# Patient Record
Sex: Female | Born: 1970 | Race: White | Hispanic: No | Marital: Married | Smoking: Never smoker
Health system: Southern US, Community
[De-identification: ages and names within clinical notes are randomized; demographics above are authoritative.]

## PROBLEM LIST (undated history)

## (undated) DIAGNOSIS — N879 Dysplasia of cervix uteri, unspecified: Secondary | ICD-10-CM

## (undated) DIAGNOSIS — E119 Type 2 diabetes mellitus without complications: Secondary | ICD-10-CM

## (undated) DIAGNOSIS — J40 Bronchitis, not specified as acute or chronic: Secondary | ICD-10-CM

## (undated) DIAGNOSIS — D649 Anemia, unspecified: Secondary | ICD-10-CM

## (undated) DIAGNOSIS — I38 Endocarditis, valve unspecified: Secondary | ICD-10-CM

## (undated) DIAGNOSIS — J189 Pneumonia, unspecified organism: Secondary | ICD-10-CM

## (undated) DIAGNOSIS — Z9889 Other specified postprocedural states: Secondary | ICD-10-CM

## (undated) DIAGNOSIS — R112 Nausea with vomiting, unspecified: Secondary | ICD-10-CM

## (undated) DIAGNOSIS — R51 Headache: Secondary | ICD-10-CM

## (undated) HISTORY — PX: TONSILLECTOMY: SUR1361

## (undated) HISTORY — PX: COLONOSCOPY: SHX174

## (undated) HISTORY — PX: CRYOTHERAPY: SHX1416

## (undated) HISTORY — PX: WISDOM TOOTH EXTRACTION: SHX21

## (undated) HISTORY — PX: DILATION AND CURETTAGE OF UTERUS: SHX78

---

## 2009-03-06 ENCOUNTER — Ambulatory Visit (HOSPITAL_COMMUNITY): Admission: AD | Admit: 2009-03-06 | Discharge: 2009-03-06 | Payer: Self-pay | Admitting: Obstetrics and Gynecology

## 2009-03-06 ENCOUNTER — Encounter (INDEPENDENT_AMBULATORY_CARE_PROVIDER_SITE_OTHER): Payer: Self-pay | Admitting: Obstetrics and Gynecology

## 2009-04-18 ENCOUNTER — Ambulatory Visit: Payer: Self-pay | Admitting: Hematology and Oncology

## 2009-04-26 LAB — IVY BLEEDING TIME: Bleeding Time: 6 Minutes (ref 2.0–8.0)

## 2009-04-26 LAB — CBC WITH DIFFERENTIAL/PLATELET
BASO%: 0.4 % (ref 0.0–2.0)
Basophils Absolute: 0 10*3/uL (ref 0.0–0.1)
EOS%: 0.3 % (ref 0.0–7.0)
Eosinophils Absolute: 0 10*3/uL (ref 0.0–0.5)
HCT: 38.1 % (ref 34.8–46.6)
HGB: 13.1 g/dL (ref 11.6–15.9)
LYMPH%: 35.7 % (ref 14.0–49.7)
MCH: 31.6 pg (ref 25.1–34.0)
MCHC: 34.4 g/dL (ref 31.5–36.0)
MCV: 92 fL (ref 79.5–101.0)
MONO#: 0.2 10*3/uL (ref 0.1–0.9)
MONO%: 5.1 % (ref 0.0–14.0)
NEUT#: 2.9 10*3/uL (ref 1.5–6.5)
NEUT%: 58.5 % (ref 38.4–76.8)
Platelets: 210 10*3/uL (ref 145–400)
RBC: 4.14 10*6/uL (ref 3.70–5.45)
RDW: 13 % (ref 11.2–14.5)
WBC: 4.9 10*3/uL (ref 3.9–10.3)
lymph#: 1.8 10*3/uL (ref 0.9–3.3)

## 2009-04-29 LAB — HYPERCOAGULABLE PANEL, COMPREHENSIVE RET.
AntiThromb III Func: 100 % (ref 76–126)
Anticardiolipin IgA: 35 [APL'U] (ref <13)
Anticardiolipin IgG: <7 [GPL'U] (ref <11)
Anticardiolipin IgM: <7 [MPL'U] (ref <10)
Beta-2 Glyco I IgG: <4 U/mL (ref <20)
Beta-2-Glycoprotein I IgA: <4 U/mL (ref <10)
Beta-2-Glycoprotein I IgM: <4 U/mL (ref <10)
DRVVT: 36.7 secs (ref 36.1–47.0)
Homocysteine: 5.6 umol/L (ref 4.0–15.4)
PTT Lupus Anticoagulant: 40.5 secs (ref 36.3–48.8)
Protein C Activity: 92 % (ref 75–133)
Protein C, Total: 76 % (ref 70–140)
Protein S Activity: 99 % (ref 69–129)
Protein S Ag, Total: 68 % — ABNORMAL LOW (ref 70–140)

## 2009-04-29 LAB — COMPREHENSIVE METABOLIC PANEL
ALT: 16 U/L (ref 0–35)
AST: 25 U/L (ref 0–37)
Albumin: 4.5 g/dL (ref 3.5–5.2)
Alkaline Phosphatase: 52 U/L (ref 39–117)
BUN: 14 mg/dL (ref 6–23)
CO2: 20 mEq/L (ref 19–32)
Calcium: 9 mg/dL (ref 8.4–10.5)
Chloride: 106 mEq/L (ref 96–112)
Creatinine, Ser: 0.73 mg/dL (ref 0.40–1.20)
Glucose, Bld: 99 mg/dL (ref 70–99)
Potassium: 4.2 mEq/L (ref 3.5–5.3)
Sodium: 139 mEq/L (ref 135–145)
Total Bilirubin: 0.6 mg/dL (ref 0.3–1.2)
Total Protein: 6.8 g/dL (ref 6.0–8.3)

## 2009-04-29 LAB — ANA: Anti Nuclear Antibody(ANA): NEGATIVE

## 2009-04-29 LAB — TSH: TSH: 1.419 u[IU]/mL (ref 0.350–4.500)

## 2009-10-06 ENCOUNTER — Ambulatory Visit (HOSPITAL_COMMUNITY): Admission: RE | Admit: 2009-10-06 | Discharge: 2009-10-06 | Payer: Self-pay | Admitting: Obstetrics and Gynecology

## 2009-11-22 ENCOUNTER — Ambulatory Visit (HOSPITAL_COMMUNITY): Admission: RE | Admit: 2009-11-22 | Discharge: 2009-11-22 | Payer: Self-pay | Admitting: Obstetrics and Gynecology

## 2010-01-03 ENCOUNTER — Ambulatory Visit (HOSPITAL_COMMUNITY): Admission: RE | Admit: 2010-01-03 | Discharge: 2010-01-03 | Payer: Self-pay | Admitting: Obstetrics and Gynecology

## 2010-04-03 ENCOUNTER — Inpatient Hospital Stay (HOSPITAL_COMMUNITY): Admission: AD | Admit: 2010-04-03 | Discharge: 2010-04-03 | Payer: Self-pay | Admitting: Obstetrics and Gynecology

## 2010-04-16 ENCOUNTER — Inpatient Hospital Stay (HOSPITAL_COMMUNITY): Admission: RE | Admit: 2010-04-16 | Discharge: 2010-04-18 | Payer: Self-pay | Admitting: Obstetrics and Gynecology

## 2011-01-13 ENCOUNTER — Encounter (HOSPITAL_COMMUNITY): Payer: Self-pay | Admitting: Obstetrics and Gynecology

## 2011-03-12 LAB — GLUCOSE, CAPILLARY
Glucose-Capillary: 70 mg/dL (ref 70–99)
Glucose-Capillary: 85 mg/dL (ref 70–99)
Glucose-Capillary: 92 mg/dL (ref 70–99)

## 2011-03-12 LAB — TYPE AND SCREEN
ABO/RH(D): A POS
Antibody Screen: NEGATIVE

## 2011-03-12 LAB — CBC
HCT: 33.1 % — ABNORMAL LOW (ref 36.0–46.0)
HCT: 34.1 % — ABNORMAL LOW (ref 36.0–46.0)
Hemoglobin: 11.3 g/dL — ABNORMAL LOW (ref 12.0–15.0)
Hemoglobin: 11.7 g/dL — ABNORMAL LOW (ref 12.0–15.0)
MCHC: 34.2 g/dL (ref 30.0–36.0)
MCHC: 34.2 g/dL (ref 30.0–36.0)
MCV: 94.8 fL (ref 78.0–100.0)
MCV: 96.3 fL (ref 78.0–100.0)
Platelets: 118 10*3/uL — ABNORMAL LOW (ref 150–400)
Platelets: 136 10*3/uL — ABNORMAL LOW (ref 150–400)
RBC: 3.44 MIL/uL — ABNORMAL LOW (ref 3.87–5.11)
RBC: 3.6 MIL/uL — ABNORMAL LOW (ref 3.87–5.11)
RDW: 14.8 % (ref 11.5–15.5)
RDW: 15.1 % (ref 11.5–15.5)
WBC: 11.1 10*3/uL — ABNORMAL HIGH (ref 4.0–10.5)
WBC: 8.1 10*3/uL (ref 4.0–10.5)

## 2011-03-12 LAB — BASIC METABOLIC PANEL
BUN: 8 mg/dL (ref 6–23)
CO2: 23 mEq/L (ref 19–32)
Calcium: 8.6 mg/dL (ref 8.4–10.5)
Chloride: 106 mEq/L (ref 96–112)
Creatinine, Ser: 0.64 mg/dL (ref 0.4–1.2)
GFR calc Af Amer: >60 mL/min (ref >60)
GFR calc non Af Amer: >60 mL/min (ref >60)
Glucose, Bld: 75 mg/dL (ref 70–99)
Potassium: 4.4 mEq/L (ref 3.5–5.1)
Sodium: 135 mEq/L (ref 135–145)

## 2011-03-12 LAB — RPR: RPR Ser Ql: NONREACTIVE

## 2011-03-12 LAB — ABO/RH: ABO/RH(D): A POS

## 2011-03-13 LAB — COMPREHENSIVE METABOLIC PANEL
ALT: 15 U/L (ref 0–35)
AST: 24 U/L (ref 0–37)
Albumin: 3.3 g/dL — ABNORMAL LOW (ref 3.5–5.2)
Alkaline Phosphatase: 106 U/L (ref 39–117)
BUN: 8 mg/dL (ref 6–23)
CO2: 24 mEq/L (ref 19–32)
Calcium: 9.4 mg/dL (ref 8.4–10.5)
Chloride: 104 mEq/L (ref 96–112)
Creatinine, Ser: 0.68 mg/dL (ref 0.4–1.2)
GFR calc Af Amer: >60 mL/min (ref >60)
GFR calc non Af Amer: >60 mL/min (ref >60)
Glucose, Bld: 77 mg/dL (ref 70–99)
Potassium: 3.7 mEq/L (ref 3.5–5.1)
Sodium: 136 mEq/L (ref 135–145)
Total Bilirubin: 0.2 mg/dL — ABNORMAL LOW (ref 0.3–1.2)
Total Protein: 6.5 g/dL (ref 6.0–8.3)

## 2011-03-13 LAB — CBC
HCT: 35.2 % — ABNORMAL LOW (ref 36.0–46.0)
Hemoglobin: 11.9 g/dL — ABNORMAL LOW (ref 12.0–15.0)
MCHC: 33.7 g/dL (ref 30.0–36.0)
MCV: 95.1 fL (ref 78.0–100.0)
Platelets: 155 10*3/uL (ref 150–400)
RBC: 3.7 MIL/uL — ABNORMAL LOW (ref 3.87–5.11)
RDW: 14.1 % (ref 11.5–15.5)
WBC: 9.2 10*3/uL (ref 4.0–10.5)

## 2011-03-13 LAB — LACTATE DEHYDROGENASE: LDH: 130 U/L (ref 94–250)

## 2011-03-13 LAB — URIC ACID: Uric Acid, Serum: 4.6 mg/dL (ref 2.4–7.0)

## 2011-04-04 LAB — CBC
HCT: 35.7 % — ABNORMAL LOW (ref 36.0–46.0)
Hemoglobin: 12.4 g/dL (ref 12.0–15.0)
MCHC: 34.7 g/dL (ref 30.0–36.0)
MCV: 93.1 fL (ref 78.0–100.0)
Platelets: 184 10*3/uL (ref 150–400)
RBC: 3.84 MIL/uL — ABNORMAL LOW (ref 3.87–5.11)
RDW: 12.4 % (ref 11.5–15.5)
WBC: 7.1 10*3/uL (ref 4.0–10.5)

## 2011-05-07 NOTE — Op Note (Signed)
Patricia Levine, Patricia Levine                 ACCOUNT NO.:  [id number]   MEDICAL RECORD NO.:  [id number]          PATIENT TYPE:  AMB   LOCATION:  [facility]                          FACILITY:  [facility]   PHYSICIAN:  Dineen Kid. Rana Snare, M.D.    DATE OF BIRTH:  Sep 24, 1971   DATE OF PROCEDURE:  DATE OF DISCHARGE:                               OPERATIVE REPORT   PREOPERATIVE DIAGNOSIS:  Intrauterine pregnancy at 9-1/2 weeks'  gestational age with known embryonic demise and incomplete abortion.   POSTOPERATIVE DIAGNOSIS:  Intrauterine pregnancy at 9-1/2 weeks'  gestational age with known embryonic demise and incomplete abortion.Marland Kitchen   PROCEDURE:  Dilation and evacuation.   SURGEON:  Dineen Kid. Rana Snare, M.D.   ANESTHESIA:  Monitored anesthetic care and paracervical block.   INDICATIONS:  [name] is a 40 year old G4, P1, and 9-1/2 weeks with a  embryonic demise based on ultrasound 3 days ago.  She presents today  with cramping and bleeding with cervical dilation and stable hemoglobin  at 12.4 with blood type A positive.  She desires to proceed with D and E  due to the cramping and bleeding.  The risks, benefits of procedure were  discussed at length.  An informed consent was obtained.   FINDINGS:  Findings at the time of surgery were products of conception.   DESCRIPTION OF PROCEDURE:  After adequate analgesia, the patient was  placed in the dorsal lithotomy position.  She was sterilely prepped and  draped.  The bladder sterilely drained.  Graves speculum was placed.  Tenaculum was placed on the anterior lip of cervix.  The cervix was  initially dilated to 1-2 cm.  An 8-mm suction curette was easily  inserted and products of conception were retrieved.  Curettage performed  until a gritty surface was felt throughout the endometrial cavity with  no residual products of conception retrieved.  The patient received  Methergine 0.2 mg IM with good uterine response.  The curette was then  removed.  Tenaculum was removed  from anterior lip of cervix, noted to be  hemostatic.  The patient was then transferred to recovery room in stable  condition.  Sponge and instrument count was normal x3.  Estimated blood  loss was minimal.  The patient received 30 mg of Toradol  postoperatively.   DISPOSITION:  The patient to be discharged home.  She will follow up in  the office in 2-3 weeks.  Sent home with routine instruction sheet for  D&E.  To return for increased pain, fever, or bleeding.  She is also  sent home with prescription for doxycycline 100 mg p.o. b.i.d. times 7  days and Methergine 0.2 mg take three times a day for 3 days.      Dineen Kid Rana Snare, M.D.  Electronically Signed     DCL/MEDQ  D:  03/06/2009  T:  03/07/2009  Job:  [id number]

## 2012-03-11 ENCOUNTER — Ambulatory Visit: Payer: Self-pay | Admitting: Sports Medicine

## 2012-04-09 ENCOUNTER — Other Ambulatory Visit: Payer: Self-pay | Admitting: Obstetrics and Gynecology

## 2012-04-09 DIAGNOSIS — N6320 Unspecified lump in the left breast, unspecified quadrant: Secondary | ICD-10-CM

## 2012-04-09 DIAGNOSIS — N644 Mastodynia: Secondary | ICD-10-CM

## 2012-04-09 DIAGNOSIS — N6310 Unspecified lump in the right breast, unspecified quadrant: Secondary | ICD-10-CM

## 2012-04-13 ENCOUNTER — Ambulatory Visit
Admission: RE | Admit: 2012-04-13 | Discharge: 2012-04-13 | Disposition: A | Discharge status: Home / Self Care | Payer: BC Managed Care – PPO | Source: Ambulatory Visit | Attending: Obstetrics and Gynecology | Admitting: Obstetrics and Gynecology

## 2012-04-13 DIAGNOSIS — N6310 Unspecified lump in the right breast, unspecified quadrant: Secondary | ICD-10-CM

## 2012-04-13 DIAGNOSIS — N6320 Unspecified lump in the left breast, unspecified quadrant: Secondary | ICD-10-CM

## 2012-04-13 DIAGNOSIS — N644 Mastodynia: Secondary | ICD-10-CM

## 2012-04-15 ENCOUNTER — Ambulatory Visit (INDEPENDENT_AMBULATORY_CARE_PROVIDER_SITE_OTHER): Payer: BC Managed Care – PPO | Admitting: Sports Medicine

## 2012-04-15 VITALS — BP 140/80

## 2012-04-15 DIAGNOSIS — M7741 Metatarsalgia, right foot: Secondary | ICD-10-CM | POA: Insufficient documentation

## 2012-04-15 DIAGNOSIS — M7750 Other enthesopathy of unspecified foot: Secondary | ICD-10-CM

## 2012-04-15 NOTE — Progress Notes (Signed)
Patient ID: Doristine Devoid, female   DOB: 1971/08/16, 41 y.o.   MRN: [id number] Pt referred to [place] by Dr. Margaretha Sheffield for foot pain while running.  Was given sports insole with metatarsal pads that proved uncomfortable.  On inspection of the metatarsal pad, it was found to have moved out of position. She has been running since age 72 No serious injuries When she gets longer than 5 miles she has had some persistent overuse injuries in the past  Heel lift and treatment for a left gastrocnemius tear has been helpful   Objective: Gen: Well developed female, no distress Feet/ankles: Drop of 3rd and 4th metatarsal heads on right.  On left foot, 3rd metatarsal head is dropped.  Pt does have significant forefoot callous.  Right leg is 1cm longer than left. Slight hammering of 3d and 4th toes on the right.  Feet are relatively neutral while standing. Pt has good running form.  Assessment/Plan: Metatarsalgia: Tx with metatarsal pad in correct place.  Pt instructed to return if she is not noticing relief.

## 2012-04-15 NOTE — Patient Instructions (Signed)
It was great to see you today! If you end up taking the pad off before your marathon, place one back after the race. Come back to see [place] if this is not getting better.

## 2012-04-15 NOTE — Assessment & Plan Note (Signed)
If she does well with the current modification we will provide her with information to get the over-the-counter insoles and metatarsal pads for other shoes  She will teslas for one month  She probably will not need custom orthotics if this works well

## 2014-08-26 ENCOUNTER — Encounter (HOSPITAL_COMMUNITY): Payer: Self-pay | Admitting: Pharmacist

## 2014-09-05 NOTE — H&P (Addendum)
43 yo with heavy irregular menses presents for surgical mngt.  Desires sterilization  PMHx:  T2DM (diet controlled) PSHx:  T&A, c-section x 2, D&C x 2 All:  Codeine Meds:  OCPs, ASA, vit E, Allegra, MTV SHx:  Neg tobacco and ivdu FHx:  DM, HTN  AF, VSS Gen - NAD Abd - soft, NT CV - RRR Lungs - clear Ext - NT  A/P:  Irregular heavy VB and desires sterilization Hysteroscopy D&C, laparoscopic BTL R/b/a discussed, questions ansered, informed consent

## 2014-09-06 ENCOUNTER — Encounter (HOSPITAL_COMMUNITY)
Admission: RE | Admit: 2014-09-06 | Discharge: 2014-09-06 | Disposition: A | Discharge status: Home / Self Care | Payer: BC Managed Care – PPO | Source: Ambulatory Visit | Attending: Obstetrics and Gynecology | Admitting: Obstetrics and Gynecology

## 2014-09-06 ENCOUNTER — Encounter (HOSPITAL_COMMUNITY): Payer: Self-pay

## 2014-09-06 DIAGNOSIS — N926 Irregular menstruation, unspecified: Secondary | ICD-10-CM | POA: Diagnosis present

## 2014-09-06 DIAGNOSIS — N920 Excessive and frequent menstruation with regular cycle: Secondary | ICD-10-CM | POA: Diagnosis not present

## 2014-09-06 DIAGNOSIS — Z302 Encounter for sterilization: Secondary | ICD-10-CM | POA: Diagnosis not present

## 2014-09-06 DIAGNOSIS — R011 Cardiac murmur, unspecified: Secondary | ICD-10-CM | POA: Diagnosis not present

## 2014-09-06 DIAGNOSIS — E119 Type 2 diabetes mellitus without complications: Secondary | ICD-10-CM | POA: Diagnosis not present

## 2014-09-06 DIAGNOSIS — D649 Anemia, unspecified: Secondary | ICD-10-CM | POA: Diagnosis not present

## 2014-09-06 HISTORY — DX: Type 2 diabetes mellitus without complications: E11.9

## 2014-09-06 HISTORY — DX: Dysplasia of cervix uteri, unspecified: N87.9

## 2014-09-06 HISTORY — DX: Headache: R51

## 2014-09-06 HISTORY — DX: Endocarditis, valve unspecified: I38

## 2014-09-06 HISTORY — DX: Other specified postprocedural states: R11.2

## 2014-09-06 HISTORY — DX: Other specified postprocedural states: Z98.890

## 2014-09-06 HISTORY — DX: Anemia, unspecified: D64.9

## 2014-09-06 LAB — CBC
HCT: 38.8 % (ref 36.0–46.0)
Hemoglobin: 13.3 g/dL (ref 12.0–15.0)
MCH: 31.5 pg (ref 26.0–34.0)
MCHC: 34.3 g/dL (ref 30.0–36.0)
MCV: 91.9 fL (ref 78.0–100.0)
Platelets: 239 10*3/uL (ref 150–400)
RBC: 4.22 MIL/uL (ref 3.87–5.11)
RDW: 12.5 % (ref 11.5–15.5)
WBC: 5.6 10*3/uL (ref 4.0–10.5)

## 2014-09-06 LAB — GLUCOSE, CAPILLARY: Glucose-Capillary: 85 mg/dL (ref 70–99)

## 2014-09-06 NOTE — Patient Instructions (Signed)
Your procedure is scheduled on:  Friday, September 09, 2014  Enter through the [employer] [facility] at: 6:00 A.M.  Pick up the phone at the desk and dial [phone].  Call this number if you have problems the morning of surgery: [phone].  Remember: Do NOT eat food: AFTER MIDNIGHT THURSDAY Do NOT drink clear liquids after: AFTER MIDNIGHT THURSDAY  Take these medicines the morning of surgery with a SIP OF WATER: NONE *STOP TAKING VITAMINS, HERBS, AND ASPIRIN  Do NOT wear jewelry (body piercing), metal hair clips/bobby pins, make-up, or nail polish. Do NOT wear lotions, powders, or perfumes.  You may wear deoderant. Do NOT shave for 48 hours prior to surgery. Do NOT bring valuables to the hospital. Contacts, dentures, or bridgework may not be worn into surgery.  Have a responsible adult drive you home and stay with you for 24 hours after your procedure

## 2014-09-06 NOTE — Pre-Procedure Instructions (Signed)
Ms. Patricia Levine capillary glucose 85

## 2014-09-08 MED ORDER — DEXTROSE 5 % IV SOLN
2.0000 g | INTRAVENOUS | Status: AC
  Administered 2014-09-09: 2 g via INTRAVENOUS
  Filled 2014-09-08: qty 2

## 2014-09-09 ENCOUNTER — Ambulatory Visit (HOSPITAL_COMMUNITY)
Admission: RE | Admit: 2014-09-09 | Discharge: 2014-09-09 | Disposition: A | Discharge status: Home / Self Care | Payer: BC Managed Care – PPO | Source: Ambulatory Visit | Attending: Obstetrics and Gynecology | Admitting: Obstetrics and Gynecology

## 2014-09-09 ENCOUNTER — Encounter (HOSPITAL_COMMUNITY): Payer: Self-pay | Admitting: Anesthesiology

## 2014-09-09 ENCOUNTER — Ambulatory Visit (HOSPITAL_COMMUNITY): Payer: BC Managed Care – PPO | Admitting: Anesthesiology

## 2014-09-09 ENCOUNTER — Encounter (HOSPITAL_COMMUNITY)
Admission: RE | Disposition: A | Discharge status: Home / Self Care | Payer: Self-pay | Source: Ambulatory Visit | Attending: Obstetrics and Gynecology

## 2014-09-09 ENCOUNTER — Encounter (HOSPITAL_COMMUNITY): Payer: BC Managed Care – PPO | Admitting: Anesthesiology

## 2014-09-09 DIAGNOSIS — Z302 Encounter for sterilization: Secondary | ICD-10-CM | POA: Insufficient documentation

## 2014-09-09 DIAGNOSIS — D649 Anemia, unspecified: Secondary | ICD-10-CM | POA: Insufficient documentation

## 2014-09-09 DIAGNOSIS — N926 Irregular menstruation, unspecified: Secondary | ICD-10-CM | POA: Insufficient documentation

## 2014-09-09 DIAGNOSIS — R011 Cardiac murmur, unspecified: Secondary | ICD-10-CM | POA: Insufficient documentation

## 2014-09-09 DIAGNOSIS — E119 Type 2 diabetes mellitus without complications: Secondary | ICD-10-CM | POA: Insufficient documentation

## 2014-09-09 DIAGNOSIS — N920 Excessive and frequent menstruation with regular cycle: Secondary | ICD-10-CM | POA: Diagnosis not present

## 2014-09-09 HISTORY — PX: LAPAROSCOPIC TUBAL LIGATION: SHX1937

## 2014-09-09 HISTORY — PX: DILITATION & CURRETTAGE/HYSTROSCOPY WITH NOVASURE ABLATION: SHX5568

## 2014-09-09 LAB — GLUCOSE, CAPILLARY: Glucose-Capillary: 97 mg/dL (ref 70–99)

## 2014-09-09 LAB — PREGNANCY, URINE: Preg Test, Ur: NEGATIVE

## 2014-09-09 SURGERY — LIGATION, FALLOPIAN TUBE, LAPAROSCOPIC
Anesthesia: General

## 2014-09-09 MED ORDER — GLYCOPYRROLATE 0.2 MG/ML IJ SOLN
INTRAMUSCULAR | Status: AC
  Filled 2014-09-09: qty 1

## 2014-09-09 MED ORDER — FENTANYL CITRATE 0.05 MG/ML IJ SOLN
INTRAMUSCULAR | Status: DC | PRN
  Administered 2014-09-09: 50 ug via INTRAVENOUS
  Administered 2014-09-09 (×2): 100 ug via INTRAVENOUS

## 2014-09-09 MED ORDER — DEXAMETHASONE SODIUM PHOSPHATE 10 MG/ML IJ SOLN
INTRAMUSCULAR | Status: DC | PRN
  Administered 2014-09-09: 4 mg via INTRAVENOUS

## 2014-09-09 MED ORDER — OXYCODONE-ACETAMINOPHEN 5-325 MG PO TABS
1.0000 | ORAL_TABLET | Freq: Once | ORAL | Status: AC | PRN
  Administered 2014-09-09: 1 via ORAL

## 2014-09-09 MED ORDER — MIDAZOLAM HCL 2 MG/2ML IJ SOLN
INTRAMUSCULAR | Status: AC
  Filled 2014-09-09: qty 2

## 2014-09-09 MED ORDER — ROCURONIUM BROMIDE 100 MG/10ML IV SOLN
INTRAVENOUS | Status: AC
  Filled 2014-09-09: qty 1

## 2014-09-09 MED ORDER — OXYCODONE-ACETAMINOPHEN 5-325 MG PO TABS
ORAL_TABLET | ORAL | Status: AC
  Filled 2014-09-09: qty 1

## 2014-09-09 MED ORDER — GLYCOPYRROLATE 0.2 MG/ML IJ SOLN
INTRAMUSCULAR | Status: DC | PRN
  Administered 2014-09-09: .4 mg via INTRAVENOUS

## 2014-09-09 MED ORDER — FENTANYL CITRATE 0.05 MG/ML IJ SOLN
25.0000 ug | INTRAMUSCULAR | Status: DC | PRN
  Administered 2014-09-09 (×4): 50 ug via INTRAVENOUS

## 2014-09-09 MED ORDER — NEOSTIGMINE METHYLSULFATE 10 MG/10ML IV SOLN
INTRAVENOUS | Status: AC
  Filled 2014-09-09: qty 1

## 2014-09-09 MED ORDER — LIDOCAINE HCL (CARDIAC) 20 MG/ML IV SOLN
INTRAVENOUS | Status: AC
  Filled 2014-09-09: qty 5

## 2014-09-09 MED ORDER — PROPOFOL 10 MG/ML IV BOLUS
INTRAVENOUS | Status: DC | PRN
  Administered 2014-09-09: 200 mg via INTRAVENOUS

## 2014-09-09 MED ORDER — LACTATED RINGERS IV SOLN
INTRAVENOUS | Status: DC
  Administered 2014-09-09 (×2): via INTRAVENOUS

## 2014-09-09 MED ORDER — METOCLOPRAMIDE HCL 5 MG/ML IJ SOLN
INTRAMUSCULAR | Status: AC
  Filled 2014-09-09: qty 2

## 2014-09-09 MED ORDER — FENTANYL CITRATE 0.05 MG/ML IJ SOLN
INTRAMUSCULAR | Status: AC
  Filled 2014-09-09: qty 5

## 2014-09-09 MED ORDER — MIDAZOLAM HCL 2 MG/2ML IJ SOLN
INTRAMUSCULAR | Status: DC | PRN
  Administered 2014-09-09: 2 mg via INTRAVENOUS

## 2014-09-09 MED ORDER — FENTANYL CITRATE 0.05 MG/ML IJ SOLN
INTRAMUSCULAR | Status: AC
  Filled 2014-09-09: qty 2

## 2014-09-09 MED ORDER — SCOPOLAMINE 1 MG/3DAYS TD PT72
MEDICATED_PATCH | TRANSDERMAL | Status: AC
  Administered 2014-09-09: 1.5 mg via TRANSDERMAL
  Filled 2014-09-09: qty 1

## 2014-09-09 MED ORDER — SCOPOLAMINE 1 MG/3DAYS TD PT72
1.0000 | MEDICATED_PATCH | Freq: Once | TRANSDERMAL | Status: DC
  Administered 2014-09-09: 1.5 mg via TRANSDERMAL

## 2014-09-09 MED ORDER — MEPERIDINE HCL 25 MG/ML IJ SOLN: 6.2500 mg | INTRAMUSCULAR | Status: DC | PRN

## 2014-09-09 MED ORDER — ONDANSETRON HCL 4 MG/2ML IJ SOLN
INTRAMUSCULAR | Status: DC | PRN
  Administered 2014-09-09: 4 mg via INTRAVENOUS

## 2014-09-09 MED ORDER — ROCURONIUM BROMIDE 100 MG/10ML IV SOLN
INTRAVENOUS | Status: DC | PRN
  Administered 2014-09-09: 40 mg via INTRAVENOUS

## 2014-09-09 MED ORDER — LIDOCAINE HCL (CARDIAC) 20 MG/ML IV SOLN
INTRAVENOUS | Status: DC | PRN
  Administered 2014-09-09: 80 mg via INTRAVENOUS

## 2014-09-09 MED ORDER — METOCLOPRAMIDE HCL 5 MG/ML IJ SOLN
10.0000 mg | Freq: Once | INTRAMUSCULAR | Status: AC | PRN
  Administered 2014-09-09: 10 mg via INTRAVENOUS

## 2014-09-09 MED ORDER — GLYCOPYRROLATE 0.2 MG/ML IJ SOLN
INTRAMUSCULAR | Status: AC
  Filled 2014-09-09: qty 2

## 2014-09-09 MED ORDER — KETOROLAC TROMETHAMINE 30 MG/ML IJ SOLN
INTRAMUSCULAR | Status: DC | PRN
  Administered 2014-09-09: 30 mg via INTRAMUSCULAR
  Administered 2014-09-09: 30 mg via INTRAVENOUS

## 2014-09-09 MED ORDER — LACTATED RINGERS IV SOLN
INTRAVENOUS | Status: DC | PRN
  Administered 2014-09-09: 3000 mL

## 2014-09-09 MED ORDER — BUPIVACAINE HCL (PF) 0.25 % IJ SOLN
INTRAMUSCULAR | Status: DC | PRN
  Administered 2014-09-09: 5 mL

## 2014-09-09 MED ORDER — ONDANSETRON HCL 4 MG/2ML IJ SOLN
INTRAMUSCULAR | Status: AC
  Filled 2014-09-09: qty 2

## 2014-09-09 MED ORDER — DEXAMETHASONE SODIUM PHOSPHATE 4 MG/ML IJ SOLN
INTRAMUSCULAR | Status: AC
  Filled 2014-09-09: qty 1

## 2014-09-09 MED ORDER — PROPOFOL 10 MG/ML IV EMUL
INTRAVENOUS | Status: AC
  Filled 2014-09-09: qty 20

## 2014-09-09 MED ORDER — LIDOCAINE HCL 1 % IJ SOLN
INTRAMUSCULAR | Status: AC
  Filled 2014-09-09: qty 20

## 2014-09-09 MED ORDER — BUPIVACAINE HCL (PF) 0.25 % IJ SOLN
INTRAMUSCULAR | Status: AC
  Filled 2014-09-09: qty 30

## 2014-09-09 MED ORDER — DIPHENHYDRAMINE HCL 50 MG/ML IJ SOLN
INTRAMUSCULAR | Status: AC
  Filled 2014-09-09: qty 1

## 2014-09-09 MED ORDER — DIPHENHYDRAMINE HCL 50 MG/ML IJ SOLN
INTRAMUSCULAR | Status: DC | PRN
  Administered 2014-09-09: 25 mg via INTRAVENOUS

## 2014-09-09 MED ORDER — NEOSTIGMINE METHYLSULFATE 10 MG/10ML IV SOLN
INTRAVENOUS | Status: DC | PRN
Start: 2014-09-09 — End: 2014-09-09
  Administered 2014-09-09: 3 mg via INTRAVENOUS

## 2014-09-09 MED ORDER — OXYCODONE-ACETAMINOPHEN 5-325 MG PO TABS: 2.0000 | ORAL_TABLET | ORAL | Status: DC | PRN

## 2014-09-09 SURGICAL SUPPLY — 30 items
ABLATOR ENDOMETRIAL BIPOLAR (ABLATOR) ×3 IMPLANT
CATH ROBINSON RED A/P 16FR (CATHETERS) ×3 IMPLANT
CHLORAPREP W/TINT 26ML (MISCELLANEOUS) ×6 IMPLANT
CLIP FILSHIE TUBAL LIGA STRL (Clip) ×3 IMPLANT
CLOTH BEACON ORANGE TIMEOUT ST (SAFETY) ×3 IMPLANT
CONTAINER PREFILL 10% NBF 60ML (FORM) ×3 IMPLANT
DERMABOND ADVANCED (GAUZE/BANDAGES/DRESSINGS) ×1
DERMABOND ADVANCED .7 DNX12 (GAUZE/BANDAGES/DRESSINGS) ×2 IMPLANT
DRAPE HYSTEROSCOPY (DRAPE) ×3 IMPLANT
DRSG COVADERM PLUS 2X2 (GAUZE/BANDAGES/DRESSINGS) ×6 IMPLANT
DRSG OPSITE POSTOP 3X4 (GAUZE/BANDAGES/DRESSINGS) ×3 IMPLANT
GLOVE BIO SURGEON STRL SZ 6.5 (GLOVE) ×3 IMPLANT
GLOVE BIOGEL PI IND STRL 7.0 (GLOVE) ×2 IMPLANT
GLOVE BIOGEL PI INDICATOR 7.0 (GLOVE) ×1
GOWN STRL REUS W/TWL LRG LVL3 (GOWN DISPOSABLE) ×6 IMPLANT
PACK LAPAROSCOPY BASIN (CUSTOM PROCEDURE TRAY) ×3 IMPLANT
PACK VAGINAL MINOR WOMEN LF (CUSTOM PROCEDURE TRAY) ×3 IMPLANT
PAD OB MATERNITY 4.3X12.25 (PERSONAL CARE ITEMS) ×3 IMPLANT
SET IRRIG TUBING LAPAROSCOPIC (IRRIGATION / IRRIGATOR) IMPLANT
SET TUBING HYSTEROSCOPY 2 NDL (TUBING) ×3 IMPLANT
SUT VIC AB 3-0 PS2 18 (SUTURE) ×1
SUT VIC AB 3-0 PS2 18XBRD (SUTURE) ×2 IMPLANT
SUT VICRYL 0 UR6 27IN ABS (SUTURE) ×3 IMPLANT
TOWEL OR 17X24 6PK STRL BLUE (TOWEL DISPOSABLE) ×6 IMPLANT
TROCAR OPTI TIP 5M 100M (ENDOMECHANICALS) ×3 IMPLANT
TROCAR XCEL NON-BLD 11X100MML (ENDOMECHANICALS) ×3 IMPLANT
TROCAR XCEL OPT SLVE 5M 100M (ENDOMECHANICALS) ×3 IMPLANT
TUBE HYSTEROSCOPY W Y-CONNECT (TUBING) ×3 IMPLANT
WARMER LAPAROSCOPE (MISCELLANEOUS) ×3 IMPLANT
WATER STERILE IRR 1000ML POUR (IV SOLUTION) ×3 IMPLANT

## 2014-09-09 NOTE — Op Note (Signed)
NAMESHARISA, TOVES NO.:  [id number]  MEDICAL RECORD NO.:  [id number]  LOCATION:  [facility]                          FACILITY:  [facility]  PHYSICIAN:  Zelphia Cairo, MD    DATE OF BIRTH:  May 07, 1971  DATE OF PROCEDURE:  09/09/2014 DATE OF DISCHARGE:                              OPERATIVE REPORT   PREOPERATIVE DIAGNOSES: 1. Desires sterilization. 2. Menorrhagia.  POSTOPERATIVE DIAGNOSES: 1. Desires sterilization. 2. Menorrhagia.  PROCEDURES: 1. Paracervical block. 2. Hysteroscopy, D and C. 3. NovaSure endometrial ablation. 4. Laparoscopic bilateral tubal ligation with Filshie clips.  SURGEON:  Zelphia Cairo, MD.  ANESTHESIA:  General.  COMPLICATIONS:  None.  SPECIMEN:  Endometrial curettings.  CONDITION:  Stable to recovery room.  PROCEDURE IN DETAIL:  The patient was taken to the operating room. After informed consent was obtained,  she was placed in the dorsal lithotomy position using Allen stirrups, given general anesthesia and prepped and draped in sterile fashion.  In and out catheter was used to drain her bladder.  Bivalve speculum was placed in the vagina and a single-tooth tenaculum attached to the anterior lip of the cervix.  The cervix was serially dilated using Pratt dilators and the uterus was sounded.  Diagnostic hysteroscope was inserted and a survey of the cavity was performed.  No abnormalities were seen.  Bilateral ostia were visualized.  Hysteroscope was removed.  A gentle endometrial curetting was performed.  Specimen was placed on Telfa and passed off to be sent to pathology.  NovaSure ablation was then performed using standard manufacture guidelines.  Once the cycle was complete, the NovaSure was removed.  The tenaculum was removed from the cervix.  The cervix was hemostatic.  Speculum was removed and our attention was turned to the abdomen.  Marcaine 0.25% was used to provide local anesthesia at the site of our infraumbilical  incision.  Infraumbilical skin incision was made with a scalpel and the optical trocar was inserted under direct visualization. Once intraperitoneal placement was confirmed, CO2 was turned on and the abdomen and pelvis were insufflated.  Bilateral fallopian tubes were visualized and appeared normal.  Filshie clips were placed bilaterally. All instruments and trocars were then removed from the abdomen.  A deep stitch was placed in the infraumbilical incision and the skin incisions were closed with Vicryl. Dermabond was placed.  The patient was extubated and taken to the recovery room in stable condition.  Sponge, lap, needle, and instrument counts were correct x2.     Zelphia Cairo, MD     GA/MEDQ  D:  09/09/2014  T:  09/09/2014  Job:  [id number]

## 2014-09-09 NOTE — Discharge Instructions (Addendum)

## 2014-09-09 NOTE — Anesthesia Preprocedure Evaluation (Addendum)
Anesthesia Evaluation  Patient identified by MRN, date of birth, ID band Patient awake    Reviewed: Allergy & Precautions, H&P , NPO status , Patient's Chart, lab work & pertinent test results  History of Anesthesia Complications (+) PONV and history of anesthetic complications  Airway Mallampati: II TM Distance: >3 FB Neck ROM: Full    Dental no notable dental hx. (+) Teeth Intact, Dental Advisory Given   Pulmonary neg pulmonary ROS,  breath sounds clear to auscultation  Pulmonary exam normal       Cardiovascular negative cardio ROS  + Valvular Problems/Murmurs Rhythm:Regular Rate:Normal     Neuro/Psych  Headaches, negative psych ROS   GI/Hepatic negative GI ROS, Neg liver ROS,   Endo/Other  diabetes  Renal/GU negative Renal ROS  negative genitourinary   Musculoskeletal negative musculoskeletal ROS (+)   Abdominal   Peds  Hematology  (+) anemia ,   Anesthesia Other Findings Orthodontic appliances  Reproductive/Obstetrics Desires permanent contraception Menorrhagia                          Anesthesia Physical Anesthesia Plan  ASA: II  Anesthesia Plan: General   Post-op Pain Management:    Induction: Intravenous  Airway Management Planned: Oral ETT  Additional Equipment:   Intra-op Plan:   Post-operative Plan: Extubation in OR  Informed Consent: I have reviewed the patients History and Physical, chart, labs and discussed the procedure including the risks, benefits and alternatives for the proposed anesthesia with the patient or authorized representative who has indicated his/her understanding and acceptance.   Dental advisory given  Plan Discussed with: Anesthesiologist, CRNA and Surgeon  Anesthesia Plan Comments:         Anesthesia Quick Evaluation

## 2014-09-09 NOTE — Transfer of Care (Signed)
Immediate Anesthesia Transfer of Care Note  Patient: Patricia Levine  Procedure(s) Performed: Procedure(s): LAPAROSCOPIC TUBAL LIGATION with Filshie Clips (Bilateral) DILATATION & CURETTAGE/HYSTEROSCOPY WITH NOVASURE ABLATION (N/A)  Patient Location: PACU  Anesthesia Type:General  Level of Consciousness: awake, alert  and oriented  Airway & Oxygen Therapy: Patient Spontanous Breathing and Patient connected to nasal cannula oxygen  Post-op Assessment: Report given to PACU RN and Post -op Vital signs reviewed and stable  Post vital signs: Reviewed and stable  Complications: No apparent anesthesia complications

## 2014-09-09 NOTE — Anesthesia Postprocedure Evaluation (Signed)
  Anesthesia Post-op Note  Patient: Patricia Levine  Procedure(s) Performed: Procedure(s): LAPAROSCOPIC TUBAL LIGATION with Filshie Clips (Bilateral) DILATATION & CURETTAGE/HYSTEROSCOPY WITH NOVASURE ABLATION (N/A)  Patient Location: PACU  Anesthesia Type:General  Level of Consciousness: awake, alert  and oriented  Airway and Oxygen Therapy: Patient Spontanous Breathing  Post-op Pain: none  Post-op Assessment: Post-op Vital signs reviewed, Patient's Cardiovascular Status Stable, Respiratory Function Stable, Patent Airway, No signs of Nausea or vomiting and Pain level controlled  Post-op Vital Signs: Reviewed and stable  Last Vitals:  Filed Vitals:   09/09/14 0915  BP: 90/52  Pulse: 44  Temp:   Resp: 14    Complications: No apparent anesthesia complications

## 2014-09-12 ENCOUNTER — Encounter (HOSPITAL_COMMUNITY): Payer: Self-pay | Admitting: Obstetrics and Gynecology

## 2015-02-03 ENCOUNTER — Other Ambulatory Visit: Payer: Self-pay | Admitting: Obstetrics and Gynecology

## 2015-02-06 LAB — CYTOLOGY - PAP

## 2015-05-30 ENCOUNTER — Emergency Department (HOSPITAL_COMMUNITY)
Admission: EM | Admit: 2015-05-30 | Discharge: 2015-05-30 | Disposition: A | Discharge status: Home / Self Care | Payer: BLUE CROSS/BLUE SHIELD | Attending: Emergency Medicine | Admitting: Emergency Medicine

## 2015-05-30 DIAGNOSIS — Z8679 Personal history of other diseases of the circulatory system: Secondary | ICD-10-CM | POA: Insufficient documentation

## 2015-05-30 DIAGNOSIS — Z87410 Personal history of cervical dysplasia: Secondary | ICD-10-CM | POA: Diagnosis not present

## 2015-05-30 DIAGNOSIS — Z862 Personal history of diseases of the blood and blood-forming organs and certain disorders involving the immune mechanism: Secondary | ICD-10-CM | POA: Insufficient documentation

## 2015-05-30 DIAGNOSIS — Z3202 Encounter for pregnancy test, result negative: Secondary | ICD-10-CM | POA: Diagnosis not present

## 2015-05-30 DIAGNOSIS — R1084 Generalized abdominal pain: Secondary | ICD-10-CM | POA: Insufficient documentation

## 2015-05-30 DIAGNOSIS — Z9889 Other specified postprocedural states: Secondary | ICD-10-CM | POA: Insufficient documentation

## 2015-05-30 DIAGNOSIS — Z79899 Other long term (current) drug therapy: Secondary | ICD-10-CM | POA: Diagnosis not present

## 2015-05-30 DIAGNOSIS — Z87440 Personal history of urinary (tract) infections: Secondary | ICD-10-CM | POA: Diagnosis not present

## 2015-05-30 DIAGNOSIS — E119 Type 2 diabetes mellitus without complications: Secondary | ICD-10-CM | POA: Insufficient documentation

## 2015-05-30 DIAGNOSIS — Z9851 Tubal ligation status: Secondary | ICD-10-CM | POA: Diagnosis not present

## 2015-05-30 DIAGNOSIS — R109 Unspecified abdominal pain: Secondary | ICD-10-CM

## 2015-05-30 DIAGNOSIS — R1030 Lower abdominal pain, unspecified: Secondary | ICD-10-CM | POA: Diagnosis present

## 2015-05-30 DIAGNOSIS — R1083 Colic: Secondary | ICD-10-CM

## 2015-05-30 LAB — COMPREHENSIVE METABOLIC PANEL
ALT: 22 U/L (ref 14–54)
AST: 27 U/L (ref 15–41)
Albumin: 4.2 g/dL (ref 3.5–5.0)
Alkaline Phosphatase: 48 U/L (ref 38–126)
Anion gap: 10 (ref 5–15)
BUN: 18 mg/dL (ref 6–20)
CO2: 25 mmol/L (ref 22–32)
Calcium: 10.2 mg/dL (ref 8.9–10.3)
Chloride: 104 mmol/L (ref 101–111)
Creatinine, Ser: 0.68 mg/dL (ref 0.44–1.00)
GFR calc Af Amer: >60 mL/min (ref >60)
GFR calc non Af Amer: >60 mL/min (ref >60)
Glucose, Bld: 114 mg/dL — ABNORMAL HIGH (ref 65–99)
Potassium: 3.8 mmol/L (ref 3.5–5.1)
Sodium: 139 mmol/L (ref 135–145)
Total Bilirubin: 0.5 mg/dL (ref 0.3–1.2)
Total Protein: 6.6 g/dL (ref 6.5–8.1)

## 2015-05-30 LAB — URINALYSIS, ROUTINE W REFLEX MICROSCOPIC
Bilirubin Urine: NEGATIVE
Glucose, UA: NEGATIVE mg/dL
Hgb urine dipstick: NEGATIVE
Ketones, ur: NEGATIVE mg/dL
Leukocytes, UA: NEGATIVE
Nitrite: NEGATIVE
Protein, ur: NEGATIVE mg/dL
Specific Gravity, Urine: 1.029 (ref 1.005–1.030)
Urobilinogen, UA: 1 mg/dL (ref 0.0–1.0)
pH: 7 (ref 5.0–8.0)

## 2015-05-30 LAB — CBC WITH DIFFERENTIAL/PLATELET
Basophils Absolute: 0 10*3/uL (ref 0.0–0.1)
Basophils Relative: 1 % (ref 0–1)
Eosinophils Absolute: 0.1 10*3/uL (ref 0.0–0.7)
Eosinophils Relative: 1 % (ref 0–5)
HCT: 37.9 % (ref 36.0–46.0)
Hemoglobin: 12.5 g/dL (ref 12.0–15.0)
Lymphocytes Relative: 23 % (ref 12–46)
Lymphs Abs: 1.5 10*3/uL (ref 0.7–4.0)
MCH: 31.6 pg (ref 26.0–34.0)
MCHC: 33 g/dL (ref 30.0–36.0)
MCV: 95.7 fL (ref 78.0–100.0)
Monocytes Absolute: 0.4 10*3/uL (ref 0.1–1.0)
Monocytes Relative: 6 % (ref 3–12)
Neutro Abs: 4.4 10*3/uL (ref 1.7–7.7)
Neutrophils Relative %: 69 % (ref 43–77)
Platelets: 237 10*3/uL (ref 150–400)
RBC: 3.96 MIL/uL (ref 3.87–5.11)
RDW: 13.1 % (ref 11.5–15.5)
WBC: 6.4 10*3/uL (ref 4.0–10.5)

## 2015-05-30 LAB — URINE MICROSCOPIC-ADD ON

## 2015-05-30 LAB — POC URINE PREG, ED: Preg Test, Ur: NEGATIVE

## 2015-05-30 MED ORDER — DICYCLOMINE HCL 20 MG PO TABS: 20.0000 mg | ORAL_TABLET | Freq: Two times a day (BID) | ORAL | Status: DC

## 2015-05-30 NOTE — ED Notes (Signed)
Pt states she took some 2 advil and 2 tums prior to calling EMS  Pt states her pain has subsided at this time  Pt states she was treated for UTI twice in May and just finished her antibiotics in the past couple days

## 2015-05-30 NOTE — ED Notes (Signed)
Patient describes her pain as a sudden onset of pain that felt like a cross between bad menstrual cramps and labor contractions constantly for about 1 hour and 45 minutes. Patient states that the pain was "taking her breath away".

## 2015-05-30 NOTE — Discharge Instructions (Signed)
Bentyl as needed with any additional episodes of cramping pain. Return to ER with fever, localizing pain, bloody stool, nausea vomiting, or other worsening symptoms   Abdominal Pain Many things can cause abdominal pain. Usually, abdominal pain is not caused by a disease and will improve without treatment. It can often be observed and treated at home. Your health care provider will do a physical exam and possibly order blood tests and X-rays to help determine the seriousness of your pain. However, in many cases, more time must pass before a clear cause of the pain can be found. Before that point, your health care provider may not know if you need more testing or further treatment. HOME CARE INSTRUCTIONS  Monitor your abdominal pain for any changes. The following actions may help to alleviate any discomfort you are experiencing:  Only take over-the-counter or prescription medicines as directed by your health care provider.  Do not take laxatives unless directed to do so by your health care provider.  Try a clear liquid diet (broth, tea, or water) as directed by your health care provider. Slowly move to a bland diet as tolerated. SEEK MEDICAL CARE IF:  You have unexplained abdominal pain.  You have abdominal pain associated with nausea or diarrhea.  You have pain when you urinate or have a bowel movement.  You experience abdominal pain that wakes you in the night.  You have abdominal pain that is worsened or improved by eating food.  You have abdominal pain that is worsened with eating fatty foods.  You have a fever. SEEK IMMEDIATE MEDICAL CARE IF:   Your pain does not go away within 2 hours.  You keep throwing up (vomiting).  Your pain is felt only in portions of the abdomen, such as the right side or the left lower portion of the abdomen.  You pass bloody or black tarry stools. MAKE SURE YOU:  Understand these instructions.   Will watch your condition.   Will get help  right away if you are not doing well or get worse.  Document Released: 09/18/2005 Document Revised: 12/14/2013 Document Reviewed: 08/18/2013 [facility] Patient Information 2015 [place], [place]. This information is not intended to replace advice given to you by your health care provider. Make sure you discuss any questions you have with your health care provider.

## 2015-05-30 NOTE — ED Notes (Signed)
Per EMS pt states she woke up at 0115 with severe abd cramping  Pt states the pain goes from umbilicus to either side   Denies N/V/D

## 2015-06-07 NOTE — ED Provider Notes (Signed)
CSN: [id number]     Arrival date & time 05/30/15  0343 History   First MD Initiated Contact with Patient 05/30/15 [phone]     Chief Complaint  Patient presents with  . Abdominal Pain      HPI  He presents for evaluation of some lower abdominal pain and cramping. States it feels like bad menstrual cramps. Given when several times over the last few days. Worse this morning she presents for evaluation. No dysuria Fritos and hematuria. Treated twice for UTI in the last month with 2 different antiemetics. No diarrhea no vomiting. No fevers chills. No flank pain.  Past Medical History  Diagnosis Date  . Leaky heart valve   . Diabetes mellitus without complication     diet controlled and exercise  . Headache(784.0)     migraines  . Cervical dysplasia   . Anemia     history  . PONV (postoperative nausea and vomiting)    Past Surgical History  Procedure Laterality Date  . Cryotherapy    . Wisdom tooth extraction    . Colonoscopy    . Dilation and curettage of uterus    . Cesarean section    . Laparoscopic tubal ligation Bilateral 09/09/2014    Procedure: LAPAROSCOPIC TUBAL LIGATION with Filshie Clips;  Surgeon: Zelphia Cairo, MD;  Location: [facility];  Service: Gynecology;  Laterality: Bilateral;  . Dilitation & currettage/hystroscopy with novasure ablation N/A 09/09/2014    Procedure: DILATATION & CURETTAGE/HYSTEROSCOPY WITH NOVASURE ABLATION;  Surgeon: Zelphia Cairo, MD;  Location: [facility];  Service: Gynecology;  Laterality: N/A;   No family history on file. History  Substance Use Topics  . Smoking status: Never Smoker   . Smokeless tobacco: Never Used  . Alcohol Use: 0.6 oz/week    1 Glasses of wine per week     Comment: nightly   OB History    No data available     Review of Systems  Constitutional: Negative for fever, chills, diaphoresis, appetite change and fatigue.  HENT: Negative for mouth sores, sore throat and trouble swallowing.   Eyes: Negative for visual  disturbance.  Respiratory: Negative for cough, chest tightness, shortness of breath and wheezing.   Cardiovascular: Negative for chest pain.  Gastrointestinal: Positive for abdominal pain. Negative for nausea, vomiting, diarrhea and abdominal distention.  Endocrine: Negative for polydipsia, polyphagia and polyuria.  Genitourinary: Negative for dysuria, frequency and hematuria.  Musculoskeletal: Negative for gait problem.  Skin: Negative for color change, pallor and rash.  Neurological: Negative for dizziness, syncope, light-headedness and headaches.  Hematological: Does not bruise/bleed easily.  Psychiatric/Behavioral: Negative for behavioral problems and confusion.      Allergies  Codeine  Home Medications   Prior to Admission medications   Medication Sig Start Date End Date Taking? Authorizing Provider  calcium carbonate (TUMS - DOSED IN MG ELEMENTAL CALCIUM) 500 MG chewable tablet Chew 2 tablets by mouth daily.   Yes Historical Provider, MD  diphenhydrAMINE (SOMINEX) 25 MG tablet Take 50 mg by mouth at bedtime as needed for sleep.   Yes Historical Provider, MD  fexofenadine (ALLEGRA) 180 MG tablet Take 180 mg by mouth daily.   Yes Historical Provider, MD  ibuprofen (ADVIL,MOTRIN) 200 MG tablet Take 400 mg by mouth every 6 (six) hours as needed for moderate pain.   Yes Historical Provider, MD  Multiple Vitamin (MULTIVITAMIN) tablet Take 1 tablet by mouth daily.   Yes Historical Provider, MD  dicyclomine (BENTYL) 20 MG tablet Take 1 tablet (20 mg total)  by mouth 2 (two) times daily. 05/30/15   Rolland Porter, MD  oxyCODONE-acetaminophen (PERCOCET/ROXICET) 5-325 MG per tablet Take 2 tablets by mouth every 4 (four) hours as needed for severe pain. Patient not taking: Reported on 05/30/2015 09/09/14   Zelphia Cairo, MD   BP 131/59 mmHg  Pulse 46  Temp(Src) 97.8 F (36.6 C) (Oral)  Resp 20  Ht  (1.727 m)  Wt 146 lb (66.225 kg)  BMI 22.20 kg/m2  SpO2 100% Physical Exam   Constitutional: She is oriented to person, place, and time. She appears well-developed and well-nourished. No distress.  HENT:  Head: Normocephalic.  Eyes: Conjunctivae are normal. Pupils are equal, round, and reactive to light. No scleral icterus.  Neck: Normal range of motion. Neck supple. No thyromegaly present.  Cardiovascular: Normal rate and regular rhythm.  Exam reveals no gallop and no friction rub.   No murmur heard. Pulmonary/Chest: Effort normal and breath sounds normal. No respiratory distress. She has no wheezes. She has no rales.  Abdominal: Soft. Bowel sounds are normal. She exhibits no distension. There is no tenderness. There is no rebound.  Musculoskeletal: Normal range of motion.  Neurological: She is alert and oriented to person, place, and time.  Skin: Skin is warm and dry. No rash noted.  Psychiatric: She has a normal mood and affect. Her behavior is normal.    ED Course  Procedures (including critical care time) Labs Review Labs Reviewed  COMPREHENSIVE METABOLIC PANEL - Abnormal; Notable for the following:    Glucose, Bld 114 (*)    All other components within normal limits  URINALYSIS, ROUTINE W REFLEX MICROSCOPIC (NOT AT [facility]) - Abnormal; Notable for the following:    Color, Urine AMBER (*)    APPearance TURBID (*)    All other components within normal limits  CBC WITH DIFFERENTIAL/PLATELET  URINE MICROSCOPIC-ADD ON  POC URINE PREG, ED    Imaging Review No results found.   EKG Interpretation None      MDM   Final diagnoses:  Abdominal pain, unspecified abdominal location  Intestinal colic    Reassuring studies. Benign exam. Asymptomatic here. Symptoms sound quite like intestinal colic. Irritable bowel syndrome discussed.  Rolland Porter, MD 06/07/15 [phone]

## 2015-09-19 ENCOUNTER — Ambulatory Visit (INDEPENDENT_AMBULATORY_CARE_PROVIDER_SITE_OTHER): Payer: BLUE CROSS/BLUE SHIELD | Admitting: Sports Medicine

## 2015-09-19 ENCOUNTER — Encounter: Payer: Self-pay | Admitting: Sports Medicine

## 2015-09-19 VITALS — BP 159/79 | Ht 68.0 in | Wt 143.0 lb

## 2015-09-19 DIAGNOSIS — M79604 Pain in right leg: Secondary | ICD-10-CM

## 2015-09-19 DIAGNOSIS — S86811A Strain of other muscle(s) and tendon(s) at lower leg level, right leg, initial encounter: Secondary | ICD-10-CM

## 2015-09-19 MED ORDER — MELOXICAM 15 MG PO TABS: ORAL_TABLET | ORAL | Status: DC

## 2015-09-19 NOTE — Patient Instructions (Signed)
It was a pleasure seeing you today in our clinic. Today we discussed your right shin pain. Here is the treatment plan we have discussed and agreed upon together:   - We recommend taking a break from running for the next 4-5 days. You can supplement with cross training (low impact). - Use the compression sleeve we provided today while this area is healing. - Ice this area liberally. - Take meloxicam once a day for the next 5 days. (with food) - Try to perform the shin exercises every day beginning in about 3 days.

## 2015-09-19 NOTE — Progress Notes (Signed)
HPI  CC: Right shin pain Patient is here today w/ complaints of right shin pain. Pain initially presented on Friday (4 days) after a 10 mile jog. She describes this initial discomfort as "tightness". Then, on Saturday she had much more pain for a 3 mile jog, she took a break on Sunday, and was unable to finish a 8 mile jog on Monday 2/2 the pain. She has taken some ibuprofen for this, w/o much benefit. She reports that she has experienced pain like this in the past when she had a stress fracture in high school. Patient reports no increase in mileage from her normal routine.  ROS: denies trauma, injury, numbness, weakness, paresthesias, erythema, edema, or ecchymosis.   Objective: BP 159/79 mmHg  Ht  (1.727 m)  Wt 143 lb (64.864 kg)  BMI 21.75 kg/m2 Gen: NAD, alert, cooperative, and pleasant. Lower Leg/Ankle: - Some minimal edema noted in distal 1/3 of tibia of Rt compared to Lt. No visible erythema or ecchymosis. - Range of motion is full in all directions. Pain experienced w/ dorsiflexion against resistance.  - Strength is 5/5 in all directions. - Stable lateral and medial ligaments; squeeze test unremarkable; - Hop test negative - TTP of distal 1/3 of anterior compartment of the lower leg. No tenderness w/ palpation of the tibia. - Talar dome nontender - No pain at base of 5th MT; No tenderness over cuboid; - No tenderness over N spot or navicular prominence - No tenderness on posterior aspects of lateral and medial malleolus - No limping or gait abnormalities appreciated. Neuro: Alert and oriented, Speech clear, No gross deficits  Ultrasound: Rt lower leg -- longitudinal and short axis views obtained showed no evidence of bony abnormalities, edema or increased vascularity to the tibial cortex. Some significant edema noted in the muscle belly of the Anterior Tibialis muscle.   Assessment and plan:  Strain of right tibialis anterior muscle History and physical exam findings  c/w Anterior tibialis muscle strain. DDx included medial tibial stress syndrome or tibial stress fracture but these were deemed significantly less likely due to the physical exam findings and ultrasound results.  - recommended resting from all jogging for 4-5 days. (cross training instead) - compression w/ a body helix sleeve provided today. - ice painful regions liberally - meloxicam  x 5 days w/ food. - Home exercise program (toe/heel walking exercises) provided today.   Meds ordered this encounter  Medications  . ciprofloxacin (CIPRO) 500 MG tablet    Sig: Take 500 mg by mouth 2 (two) times daily. for 10 days    Refill:  0  . sulfamethoxazole-trimethoprim (BACTRIM DS,SEPTRA DS) 800-160 MG per tablet    Sig:     Refill:  0  . fluticasone (FLONASE) 50 MCG/ACT nasal spray    Sig: INSTILL 2 SPRAYS INTO EACH NOSTRIL DAILY    Refill:  0  . ALPRAZolam (XANAX) 0.25 MG tablet    Sig: take 1-2 tablets by mouth three times a day if needed for anxiety WITH FLYING    Refill:  0  . meloxicam (MOBIC) 15 MG tablet    Sig: Take one tablet daily for 5 days, then take as needed. Take with food.    Dispense:  30 tablet    Refill:  1     Kathee Delton, MD,MS,  PGY2 09/19/2015 10:52 AM  Patient seen and evaluated with the above-named resident. I agree with the plan of care. Physical exam and ultrasound findings are consistent  with anterior tibialis tendon strain. We will treat with oral anti-inflammatories, relative rest, ice, and a body helix compression sleeve. Symptoms should resolve rather quickly over the next week or 2. I recommended that she cross train on a bike for the next 5-7 days before resuming running. Follow-up if symptoms persist or worsen.

## 2015-09-19 NOTE — Assessment & Plan Note (Signed)
History and physical exam findings c/w Anterior tibialis muscle strain. DDx included medial tibial stress syndrome or tibial stress fracture but these were deemed significantly less likely due to the physical exam findings and ultrasound results.  - recommended resting from all jogging for 4-5 days. (cross training instead) - compression w/ a body helix sleeve provided today. - ice painful regions liberally - meloxicam  x 5 days w/ food. - Home exercise program (toe/heel walking exercises) provided today.

## 2016-04-03 NOTE — H&P (Addendum)
Patricia Levine is an 45 y.o. female G5P2 presents for surgical mngt of menorrhagia.  Declines medical management.  Pt s/p endometrial ablation     Menstrual History: No LMP recorded. Patient has had an ablation.    Past Medical History  Diagnosis Date  . Leaky heart valve   . Diabetes mellitus without complication     diet controlled and exercise  . Headache(784.0)     migraines  . Cervical dysplasia   . Anemia     history  . PONV (postoperative nausea and vomiting)     Past Surgical History  Procedure Laterality Date  . Cryotherapy    . Wisdom tooth extraction    . Colonoscopy    . Dilation and curettage of uterus    . Cesarean section    . Laparoscopic tubal ligation Bilateral 09/09/2014    Procedure: LAPAROSCOPIC TUBAL LIGATION with Filshie Clips;  Surgeon: Zelphia CairoGretchen Malikye Reppond, MD;  Location: [facility];  Service: Gynecology;  Laterality: Bilateral;  . Dilitation & currettage/hystroscopy with novasure ablation N/A 09/09/2014    Procedure: DILATATION & CURETTAGE/HYSTEROSCOPY WITH NOVASURE ABLATION;  Surgeon: Zelphia CairoGretchen Ikeisha Blumberg, MD;  Location: [facility];  Service: Gynecology;  Laterality: N/A;    No family history on file.  Social History:  reports that she has never smoked. She has never used smokeless tobacco. She reports that she drinks about 0.6 oz of alcohol per week. She reports that she does not use illicit drugs.  Allergies:  Allergies  Allergen Reactions  . Codeine Nausea And Vomiting   Meds:  OCPs, Flonase   ROS  AF, VSS  Physical Exam  Gen - NAD Abd - soft, NT/ND CV - RRR Lungs - clear Ext - NT PV - uterus NT  PV US:  No adnexal masses or free fluid.  Normal size uterus  Assessment/Plan: Menorrhagia LAVH/BS, possible TAH R/b/a discussed, questions answered, informed consent  Patricia Levine 04/03/2016, 1:33 PM

## 2016-04-11 NOTE — Patient Instructions (Signed)
Your procedure is scheduled on:  Tuesday, April 16, 2016  [facility] through [facility] at:  6:00 AM  Pick up the phone at the desk and dial [phone].  Call this number if you have problems the morning of surgery: [phone].  Remember: Do NOT eat food or drink after:  Midnight Monday  Take these medicines the morning of surgery with a SIP OF WATER: None  Do NOT wear jewelry (body piercing), metal hair clips/bobby pins, make-up, or nail polish. Do NOT wear lotions, powders, or perfumes.  You may wear deodorant. Do NOT shave for 48 hours prior to surgery. Do NOT bring valuables to the hospital. Contacts, dentures, or bridgework may not be worn into surgery.  Leave suitcase in car.  After surgery it may be brought to your room.  For patients admitted to the hospital, checkout time is 11:00 AM the day of discharge.

## 2016-04-12 ENCOUNTER — Encounter (HOSPITAL_COMMUNITY): Payer: Self-pay

## 2016-04-12 ENCOUNTER — Encounter (HOSPITAL_COMMUNITY)
Admission: RE | Admit: 2016-04-12 | Discharge: 2016-04-12 | Disposition: A | Discharge status: Home / Self Care | Payer: BLUE CROSS/BLUE SHIELD | Source: Ambulatory Visit | Attending: Obstetrics and Gynecology | Admitting: Obstetrics and Gynecology

## 2016-04-12 DIAGNOSIS — N920 Excessive and frequent menstruation with regular cycle: Secondary | ICD-10-CM | POA: Diagnosis not present

## 2016-04-12 DIAGNOSIS — E119 Type 2 diabetes mellitus without complications: Secondary | ICD-10-CM | POA: Insufficient documentation

## 2016-04-12 DIAGNOSIS — Z01812 Encounter for preprocedural laboratory examination: Secondary | ICD-10-CM | POA: Diagnosis not present

## 2016-04-12 HISTORY — DX: Pneumonia, unspecified organism: J18.9

## 2016-04-12 HISTORY — DX: Bronchitis, not specified as acute or chronic: J40

## 2016-04-12 LAB — CBC
HCT: 39.8 % (ref 36.0–46.0)
Hemoglobin: 13.4 g/dL (ref 12.0–15.0)
MCH: 32 pg (ref 26.0–34.0)
MCHC: 33.7 g/dL (ref 30.0–36.0)
MCV: 95 fL (ref 78.0–100.0)
Platelets: 220 10*3/uL (ref 150–400)
RBC: 4.19 MIL/uL (ref 3.87–5.11)
RDW: 12.9 % (ref 11.5–15.5)
WBC: 5.1 10*3/uL (ref 4.0–10.5)

## 2016-04-12 LAB — BASIC METABOLIC PANEL
Anion gap: 7 (ref 5–15)
BUN: 15 mg/dL (ref 6–20)
CO2: 27 mmol/L (ref 22–32)
Calcium: 9.4 mg/dL (ref 8.9–10.3)
Chloride: 106 mmol/L (ref 101–111)
Creatinine, Ser: 0.85 mg/dL (ref 0.44–1.00)
GFR calc Af Amer: >60 mL/min (ref >60)
GFR calc non Af Amer: >60 mL/min (ref >60)
Glucose, Bld: 88 mg/dL (ref 65–99)
Potassium: 4.3 mmol/L (ref 3.5–5.1)
Sodium: 140 mmol/L (ref 135–145)

## 2016-04-12 LAB — TYPE AND SCREEN
ABO/RH(D): A POS
Antibody Screen: NEGATIVE

## 2016-04-15 MED ORDER — DEXTROSE 5 % IV SOLN
2.0000 g | INTRAVENOUS | Status: AC
  Administered 2016-04-16: 2 g via INTRAVENOUS
  Filled 2016-04-15: qty 2

## 2016-04-16 ENCOUNTER — Observation Stay (HOSPITAL_COMMUNITY)
Admission: RE | Admit: 2016-04-16 | Discharge: 2016-04-17 | Disposition: A | Discharge status: Home / Self Care | Payer: BLUE CROSS/BLUE SHIELD | Source: Ambulatory Visit | Attending: Obstetrics and Gynecology | Admitting: Obstetrics and Gynecology

## 2016-04-16 ENCOUNTER — Ambulatory Visit (HOSPITAL_COMMUNITY): Payer: BLUE CROSS/BLUE SHIELD | Admitting: Anesthesiology

## 2016-04-16 ENCOUNTER — Encounter (HOSPITAL_COMMUNITY)
Admission: RE | Disposition: A | Discharge status: Home / Self Care | Payer: Self-pay | Source: Ambulatory Visit | Attending: Obstetrics and Gynecology

## 2016-04-16 ENCOUNTER — Encounter (HOSPITAL_COMMUNITY): Payer: Self-pay | Admitting: Emergency Medicine

## 2016-04-16 DIAGNOSIS — E119 Type 2 diabetes mellitus without complications: Secondary | ICD-10-CM | POA: Diagnosis not present

## 2016-04-16 DIAGNOSIS — Z23 Encounter for immunization: Secondary | ICD-10-CM | POA: Insufficient documentation

## 2016-04-16 DIAGNOSIS — Z885 Allergy status to narcotic agent status: Secondary | ICD-10-CM | POA: Diagnosis not present

## 2016-04-16 DIAGNOSIS — Z9851 Tubal ligation status: Secondary | ICD-10-CM | POA: Insufficient documentation

## 2016-04-16 DIAGNOSIS — N920 Excessive and frequent menstruation with regular cycle: Secondary | ICD-10-CM | POA: Diagnosis not present

## 2016-04-16 HISTORY — PX: CYSTOSCOPY: SHX5120

## 2016-04-16 HISTORY — PX: LAPAROSCOPIC VAGINAL HYSTERECTOMY WITH SALPINGECTOMY: SHX6680

## 2016-04-16 LAB — GLUCOSE, CAPILLARY
Glucose-Capillary: 84 mg/dL (ref 65–99)
Glucose-Capillary: 136 mg/dL — ABNORMAL HIGH (ref 65–99)

## 2016-04-16 LAB — PREGNANCY, URINE: Preg Test, Ur: NEGATIVE

## 2016-04-16 SURGERY — HYSTERECTOMY, VAGINAL, LAPAROSCOPY-ASSISTED, WITH SALPINGECTOMY
Anesthesia: General | Site: Bladder

## 2016-04-16 MED ORDER — ONDANSETRON HCL 4 MG/2ML IJ SOLN
INTRAMUSCULAR | Status: DC | PRN
  Administered 2016-04-16: 4 mg via INTRAVENOUS

## 2016-04-16 MED ORDER — KETOROLAC TROMETHAMINE 30 MG/ML IJ SOLN
30.0000 mg | Freq: Three times a day (TID) | INTRAMUSCULAR | Status: AC
  Administered 2016-04-16 – 2016-04-17 (×3): 30 mg via INTRAVENOUS
  Filled 2016-04-16 (×3): qty 1

## 2016-04-16 MED ORDER — BUPIVACAINE HCL (PF) 0.25 % IJ SOLN
INTRAMUSCULAR | Status: AC
  Filled 2016-04-16: qty 30

## 2016-04-16 MED ORDER — FLUTICASONE PROPIONATE 50 MCG/ACT NA SUSP
2.0000 | Freq: Every day | NASAL | Status: DC
  Filled 2016-04-16: qty 16

## 2016-04-16 MED ORDER — ROCURONIUM BROMIDE 100 MG/10ML IV SOLN
INTRAVENOUS | Status: DC | PRN
  Administered 2016-04-16: 40 mg via INTRAVENOUS
  Administered 2016-04-16: 5 mg via INTRAVENOUS

## 2016-04-16 MED ORDER — HYDROMORPHONE HCL 1 MG/ML IJ SOLN
INTRAMUSCULAR | Status: AC
  Filled 2016-04-16: qty 1

## 2016-04-16 MED ORDER — LIDOCAINE HCL (CARDIAC) 20 MG/ML IV SOLN
INTRAVENOUS | Status: DC | PRN
  Administered 2016-04-16: 60 mg via INTRAVENOUS
  Administered 2016-04-16: 40 mg via INTRAVENOUS

## 2016-04-16 MED ORDER — BUPIVACAINE HCL (PF) 0.25 % IJ SOLN
INTRAMUSCULAR | Status: DC | PRN
  Administered 2016-04-16: 3 mL

## 2016-04-16 MED ORDER — HYDROMORPHONE HCL 1 MG/ML IJ SOLN
1.0000 mg | INTRAMUSCULAR | Status: DC | PRN
  Administered 2016-04-16: 1 mg via INTRAVENOUS
  Filled 2016-04-16 (×2): qty 1

## 2016-04-16 MED ORDER — PNEUMOCOCCAL VAC POLYVALENT 25 MCG/0.5ML IJ INJ
0.5000 mL | INJECTION | INTRAMUSCULAR | Status: AC
  Administered 2016-04-17: 0.5 mL via INTRAMUSCULAR
  Filled 2016-04-16: qty 0.5

## 2016-04-16 MED ORDER — LACTATED RINGERS IV SOLN
INTRAVENOUS | Status: DC
  Administered 2016-04-16 (×3): via INTRAVENOUS

## 2016-04-16 MED ORDER — MIDAZOLAM HCL 2 MG/2ML IJ SOLN
INTRAMUSCULAR | Status: AC
  Filled 2016-04-16: qty 2

## 2016-04-16 MED ORDER — GLYCOPYRROLATE 0.2 MG/ML IJ SOLN
INTRAMUSCULAR | Status: DC | PRN
  Administered 2016-04-16: 0.3 mg via INTRAVENOUS
  Administered 2016-04-16: .6 mg via INTRAVENOUS

## 2016-04-16 MED ORDER — ATROPINE SULFATE 1 MG/ML IJ SOLN
0.5000 mg | Freq: Once | INTRAMUSCULAR | Status: AC
  Administered 2016-04-16: 0.5 mg via INTRAVENOUS
  Filled 2016-04-16: qty 0.5

## 2016-04-16 MED ORDER — DEXAMETHASONE SODIUM PHOSPHATE 4 MG/ML IJ SOLN
INTRAMUSCULAR | Status: DC | PRN
  Administered 2016-04-16 (×2): 4 mg via INTRAVENOUS

## 2016-04-16 MED ORDER — STERILE WATER FOR IRRIGATION IR SOLN
Status: DC | PRN
  Administered 2016-04-16: 1000 mL via INTRAVESICAL

## 2016-04-16 MED ORDER — LACTATED RINGERS IR SOLN
Status: DC | PRN
  Administered 2016-04-16: 3000 mL

## 2016-04-16 MED ORDER — ATROPINE SULFATE 0.1 MG/ML IJ SOLN
INTRAMUSCULAR | Status: AC
  Filled 2016-04-16: qty 10

## 2016-04-16 MED ORDER — FENTANYL CITRATE (PF) 100 MCG/2ML IJ SOLN
25.0000 ug | INTRAMUSCULAR | Status: DC | PRN
  Administered 2016-04-16: 25 ug via INTRAVENOUS

## 2016-04-16 MED ORDER — ONDANSETRON HCL 4 MG/2ML IJ SOLN: 4.0000 mg | Freq: Four times a day (QID) | INTRAMUSCULAR | Status: DC | PRN

## 2016-04-16 MED ORDER — DEXAMETHASONE SODIUM PHOSPHATE 4 MG/ML IJ SOLN
INTRAMUSCULAR | Status: AC
  Filled 2016-04-16: qty 1

## 2016-04-16 MED ORDER — LIDOCAINE HCL (CARDIAC) 20 MG/ML IV SOLN
INTRAVENOUS | Status: AC
  Filled 2016-04-16: qty 5

## 2016-04-16 MED ORDER — KETOROLAC TROMETHAMINE 30 MG/ML IJ SOLN: 30.0000 mg | Freq: Once | INTRAMUSCULAR | Status: DC | PRN

## 2016-04-16 MED ORDER — ROCURONIUM BROMIDE 100 MG/10ML IV SOLN
INTRAVENOUS | Status: AC
  Filled 2016-04-16: qty 1

## 2016-04-16 MED ORDER — PROPOFOL 10 MG/ML IV BOLUS
INTRAVENOUS | Status: AC
  Filled 2016-04-16: qty 20

## 2016-04-16 MED ORDER — KETOROLAC TROMETHAMINE 30 MG/ML IJ SOLN
INTRAMUSCULAR | Status: DC | PRN
  Administered 2016-04-16: 30 mg via INTRAVENOUS

## 2016-04-16 MED ORDER — GLYCOPYRROLATE 0.2 MG/ML IJ SOLN
INTRAMUSCULAR | Status: AC
  Filled 2016-04-16: qty 4

## 2016-04-16 MED ORDER — FENTANYL CITRATE (PF) 250 MCG/5ML IJ SOLN
INTRAMUSCULAR | Status: AC
  Filled 2016-04-16: qty 5

## 2016-04-16 MED ORDER — HYDROCODONE-ACETAMINOPHEN 5-325 MG PO TABS
1.0000 | ORAL_TABLET | ORAL | Status: DC | PRN
  Administered 2016-04-16 – 2016-04-17 (×2): 1 via ORAL
  Filled 2016-04-16 (×2): qty 1

## 2016-04-16 MED ORDER — DIPHENHYDRAMINE HCL 50 MG/ML IJ SOLN
12.5000 mg | Freq: Four times a day (QID) | INTRAMUSCULAR | Status: DC | PRN
Start: 2016-04-16 — End: 2016-04-17
  Administered 2016-04-16: 12.5 mg via INTRAVENOUS
  Filled 2016-04-16: qty 1

## 2016-04-16 MED ORDER — MENTHOL 3 MG MT LOZG
1.0000 | LOZENGE | OROMUCOSAL | Status: DC | PRN
Start: 2016-04-16 — End: 2016-04-17

## 2016-04-16 MED ORDER — MIDAZOLAM HCL 5 MG/5ML IJ SOLN
INTRAMUSCULAR | Status: DC | PRN
  Administered 2016-04-16: 2 mg via INTRAVENOUS

## 2016-04-16 MED ORDER — FENTANYL CITRATE (PF) 100 MCG/2ML IJ SOLN
INTRAMUSCULAR | Status: DC | PRN
  Administered 2016-04-16: 50 ug via INTRAVENOUS
  Administered 2016-04-16: 100 ug via INTRAVENOUS
  Administered 2016-04-16 (×2): 50 ug via INTRAVENOUS

## 2016-04-16 MED ORDER — ONDANSETRON HCL 4 MG PO TABS: 4.0000 mg | ORAL_TABLET | Freq: Four times a day (QID) | ORAL | Status: DC | PRN

## 2016-04-16 MED ORDER — ONDANSETRON HCL 4 MG/2ML IJ SOLN
INTRAMUSCULAR | Status: AC
  Filled 2016-04-16: qty 2

## 2016-04-16 MED ORDER — FENTANYL CITRATE (PF) 100 MCG/2ML IJ SOLN
INTRAMUSCULAR | Status: AC
  Filled 2016-04-16: qty 2

## 2016-04-16 MED ORDER — KETOROLAC TROMETHAMINE 30 MG/ML IJ SOLN
INTRAMUSCULAR | Status: AC
  Filled 2016-04-16: qty 1

## 2016-04-16 MED ORDER — SCOPOLAMINE 1 MG/3DAYS TD PT72
MEDICATED_PATCH | TRANSDERMAL | Status: AC
  Administered 2016-04-16: 1.5 mg via TRANSDERMAL
  Filled 2016-04-16: qty 1

## 2016-04-16 MED ORDER — DEXTROSE IN LACTATED RINGERS 5 % IV SOLN
INTRAVENOUS | Status: DC
  Administered 2016-04-16: 18:00:00 via INTRAVENOUS

## 2016-04-16 MED ORDER — SCOPOLAMINE 1 MG/3DAYS TD PT72
1.0000 | MEDICATED_PATCH | Freq: Once | TRANSDERMAL | Status: DC
  Administered 2016-04-16: 1.5 mg via TRANSDERMAL

## 2016-04-16 MED ORDER — PROPOFOL 10 MG/ML IV BOLUS
INTRAVENOUS | Status: DC | PRN
  Administered 2016-04-16: 30 mg via INTRAVENOUS
  Administered 2016-04-16: 160 mg via INTRAVENOUS

## 2016-04-16 MED ORDER — HYDROMORPHONE HCL 1 MG/ML IJ SOLN
INTRAMUSCULAR | Status: DC | PRN
  Administered 2016-04-16: 1 mg via INTRAVENOUS

## 2016-04-16 MED ORDER — NEOSTIGMINE METHYLSULFATE 10 MG/10ML IV SOLN
INTRAVENOUS | Status: DC | PRN
  Administered 2016-04-16: 3 mg via INTRAVENOUS

## 2016-04-16 MED ORDER — NEOSTIGMINE METHYLSULFATE 10 MG/10ML IV SOLN
INTRAVENOUS | Status: AC
  Filled 2016-04-16: qty 1

## 2016-04-16 SURGICAL SUPPLY — 61 items
CABLE HIGH FREQUENCY MONO STRZ (ELECTRODE) IMPLANT
CANISTER SUCT 3000ML (MISCELLANEOUS) ×4 IMPLANT
CLOTH BEACON ORANGE TIMEOUT ST (SAFETY) ×4 IMPLANT
CONT PATH 16OZ SNAP LID 3702 (MISCELLANEOUS) ×4 IMPLANT
COVER BACK TABLE 60X90IN (DRAPES) ×4 IMPLANT
DECANTER SPIKE VIAL GLASS SM (MISCELLANEOUS) ×4 IMPLANT
DRAPE CESAREAN BIRTH W POUCH (DRAPES) IMPLANT
DRAPE WARM FLUID 44X44 (DRAPE) IMPLANT
DRSG COVADERM PLUS 2X2 (GAUZE/BANDAGES/DRESSINGS) IMPLANT
DRSG OPSITE POSTOP 3X4 (GAUZE/BANDAGES/DRESSINGS) ×4 IMPLANT
DRSG OPSITE POSTOP 4X10 (GAUZE/BANDAGES/DRESSINGS) IMPLANT
DURAPREP 26ML APPLICATOR (WOUND CARE) ×4 IMPLANT
ELECT LIGASURE LONG (ELECTRODE) IMPLANT
ELECT LIGASURE SHORT 9 REUSE (ELECTRODE) ×4 IMPLANT
ELECT REM PT RETURN 9FT ADLT (ELECTROSURGICAL) ×4
ELECTRODE REM PT RTRN 9FT ADLT (ELECTROSURGICAL) ×3 IMPLANT
GAUZE SPONGE 4X4 16PLY XRAY LF (GAUZE/BANDAGES/DRESSINGS) IMPLANT
GLOVE BIO SURGEON STRL SZ 6.5 (GLOVE) ×8 IMPLANT
GLOVE BIOGEL PI IND STRL 6.5 (GLOVE) ×3 IMPLANT
GLOVE BIOGEL PI IND STRL 7.0 (GLOVE) ×12 IMPLANT
GLOVE BIOGEL PI INDICATOR 6.5 (GLOVE) ×1
GLOVE BIOGEL PI INDICATOR 7.0 (GLOVE) ×4
GOWN STRL REUS W/TWL LRG LVL3 (GOWN DISPOSABLE) IMPLANT
HEMOSTAT SURGICEL 4X8 (HEMOSTASIS) IMPLANT
LEGGING LITHOTOMY PAIR STRL (DRAPES) ×4 IMPLANT
LIQUID BAND (GAUZE/BANDAGES/DRESSINGS) ×4 IMPLANT
NS IRRIG 1000ML POUR BTL (IV SOLUTION) ×4 IMPLANT
PACK ABDOMINAL GYN (CUSTOM PROCEDURE TRAY) IMPLANT
PACK LAVH (CUSTOM PROCEDURE TRAY) ×4 IMPLANT
PACK ROBOTIC GOWN (GOWN DISPOSABLE) ×4 IMPLANT
PAD OB MATERNITY 4.3X12.25 (PERSONAL CARE ITEMS) ×4 IMPLANT
PAD TRENDELENBURG POSITION (MISCELLANEOUS) ×4 IMPLANT
PENCIL SMOKE EVAC W/HOLSTER (ELECTROSURGICAL) IMPLANT
SEALER TISSUE G2 CVD JAW 45CM (ENDOMECHANICALS) ×4 IMPLANT
SET CYSTO W/LG BORE CLAMP LF (SET/KITS/TRAYS/PACK) ×4 IMPLANT
SET IRRIG TUBING LAPAROSCOPIC (IRRIGATION / IRRIGATOR) ×4 IMPLANT
SLEEVE XCEL OPT CAN 5 100 (ENDOMECHANICALS) ×4 IMPLANT
SPONGE LAP 18X18 X RAY DECT (DISPOSABLE) IMPLANT
STAPLER VISISTAT 35W (STAPLE) IMPLANT
SUT MNCRL 0 MO-4 VIOLET 18 CR (SUTURE) ×9 IMPLANT
SUT MNCRL 0 VIOLET 6X18 (SUTURE) ×3 IMPLANT
SUT MON AB 2-0 CT1 36 (SUTURE) ×4 IMPLANT
SUT MON AB 3-0 SH 27 (SUTURE)
SUT MON AB 3-0 SH27 (SUTURE) IMPLANT
SUT MON AB-0 CT1 36 (SUTURE) ×4 IMPLANT
SUT MONOCRYL 0 6X18 (SUTURE) ×1
SUT MONOCRYL 0 MO 4 18  CR/8 (SUTURE) ×3
SUT PDS AB 0 CTX 60 (SUTURE) IMPLANT
SUT PLAIN 2 0 XLH (SUTURE) IMPLANT
SUT VIC AB 3-0 PS2 18 (SUTURE) ×1
SUT VIC AB 3-0 PS2 18XBRD (SUTURE) ×3 IMPLANT
SUT VIC AB 3-0 X1 27 (SUTURE) IMPLANT
SUT VIC AB 4-0 KS 27 (SUTURE) IMPLANT
SUT VICRYL 0 UR6 27IN ABS (SUTURE) ×4 IMPLANT
TOWEL OR 17X24 6PK STRL BLUE (TOWEL DISPOSABLE) ×8 IMPLANT
TRAY FOLEY CATH SILVER 14FR (SET/KITS/TRAYS/PACK) ×4 IMPLANT
TROCAR OPTI TIP 5M 100M (ENDOMECHANICALS) ×4 IMPLANT
TROCAR XCEL NON-BLD 11X100MML (ENDOMECHANICALS) ×4 IMPLANT
WARMER LAPAROSCOPE (MISCELLANEOUS) ×4 IMPLANT
WATER STERILE IRR 1000ML POUR (IV SOLUTION) IMPLANT
WATER STERILE IRR 1000ML UROMA (IV SOLUTION) ×4 IMPLANT

## 2016-04-16 NOTE — Transfer of Care (Signed)
Immediate Anesthesia Transfer of Care Note  Patient: Patricia Levine  Procedure(s) Performed: Procedure(s): LAPAROSCOPIC ASSISTED VAGINAL HYSTERECTOMY WITH BILATERAL SALPINGECTOMY (Bilateral) POSSIBLE ABDOMINAL HYSTERECTOMY  (N/A)  Patient Location: PACU  Anesthesia Type:General  Level of Consciousness: awake and oriented  Airway & Oxygen Therapy: Patient Spontanous Breathing and Patient connected to face mask oxygen  Post-op Assessment: Report given to RN and Post -op Vital signs reviewed and stable  Post vital signs: Reviewed and stable  Last Vitals:  Filed Vitals:   04/16/16 0616  BP: 134/83  Pulse: 61  Temp: 36.8 C  Resp: 18    Complications: No apparent anesthesia complications

## 2016-04-16 NOTE — Anesthesia Procedure Notes (Signed)
Procedure Name: Intubation Date/Time: 04/16/2016 7:38 AM Performed by: Janeece AgeeWRAPE, Miriana Gaertner W Pre-anesthesia Checklist: Patient identified, Emergency Drugs available, Suction available, Patient being monitored and Timeout performed Patient Re-evaluated:Patient Re-evaluated prior to inductionOxygen Delivery Method: Circle system utilized and Simple face mask Preoxygenation: Pre-oxygenation with 100% oxygen Intubation Type: IV induction Ventilation: Mask ventilation without difficulty Laryngoscope Size: Mac and 3 Grade View: Grade I Tube size: 7.0 mm Number of attempts: 1 Airway Equipment and Method: Stylet Secured at: 21 cm Tube secured with: Tape Dental Injury: Teeth and Oropharynx as per pre-operative assessment

## 2016-04-16 NOTE — Op Note (Signed)
[name]:  [name], [name]                 ACCOUNT NO.:  [id number]  MEDICAL RECORD NO.:  [id number]  LOCATION:  9316                          FACILITY:  [facility]  PHYSICIAN:  Zelphia CairoGretchen Kennett Symes, MD    DATE OF BIRTH:  17-Mar-1971  DATE OF PROCEDURE:  04/16/2016 DATE OF DISCHARGE:                              OPERATIVE REPORT   PREOPERATIVE DIAGNOSIS:  Menorrhagia.  POSTOPERATIVE DIAGNOSIS:  Menorrhagia.  PROCEDURE: 1. Laparoscopic-assisted vaginal hysterectomy. 2. Bilateral salpingectomy. 3. Cystoscopy.  SURGEON:  Zelphia CairoGretchen Elah Avellino, MD.  ANESTHESIA:  General.  SURGICAL ASSISTANT:  Juluis MireJohn S. McComb, M.D.  ESTIMATED BLOOD LOSS:  300 mL.  URINE OUTPUT:  Clear.  DESCRIPTION OF PROCEDURE:  The patient was taken to the operating room after informed consent was obtained.  She was given general anesthesia. Placed in the dorsal lithotomy position using Allen stirrups.  Prepped and draped in sterile fashion.  Bivalve speculum was placed in the vagina and a Hulka clamp was placed on the cervix.  Speculum was removed and our attention was turned to the abdomen.  0.25% Marcaine was used to provide local anesthesia at the sites of the infraumbilical and suprapubic incisions.  Incisions were made with a scalpel and extended bluntly to the level of the fascia.  Optical trocar was inserted under direct visualization.  Once intraperitoneal placement was confirmed, CO2 was turned on and the abdomen and pelvis were insufflated.  A survey revealed a normal appearing uterus and bilateral fallopian tubes. Filshie clip was noted on the left tube, but not the right.  Bilateral ovaries appeared normal.  A suprapubic incision was made and a 5 mm trocar was inserted under direct visualization.  The right fallopian tube was grasped and tented toward the midline.  EnSeal device was used to clamp, cauterize, and cut the mesosalpinx.  EnSeal was then used to clamp, cauterize, and cut the broad ligament, the  utero-ovarian ligament, and the round ligament.  Hemostasis was assured, and this procedure was repeated on the left.  Hemostasis was noted bilaterally. All instruments removed from the abdomen and our attention was turned to the vagina.  A Foley catheter was placed and a weighted speculum was placed posteriorly.  Hulka clamp was removed and a Deaver was placed anteriorly.  The cervix was grasped and a circumferential incision was made.  Posterior cul-de-sac was entered sharply using curved Mayo scissors, and a long weighted speculum was placed in the posterior cul- de-sac.  Bilateral uterosacral ligaments were grasped with clamps, cut, and suture ligated.  Hemostasis was noted.  The anterior cul-de-sac was entered sharply, and a Deaver was placed anteriorly.  The uterine arteries and cardinal ligaments were clamped, cut, and suture ligated. Hemostasis was noted.  The uterus was delivered through the posterior cul-de-sac.  Remaining pedicles were clamped, and the uterus was amputated.  Pedicles were suture ligated.  Hemostasis was noted throughout the posterior cuff was run using a running lock suture.  The remainder of the vaginal cuff was closed using a series of figure-of- eight stitches.  Hemostasis was assured.  A cystoscopy was performed. No evidence of injury or suture to the bladder was identified. Cystoscope was removed and Foley catheter  was replaced.  Our attention was then turned to the abdomen.  The abdomen was re-insufflated.  Small area of oozing was noted at the bladder flap, this was cauterized. Blood and clot were suctioned from the pelvis and hemostasis was assured throughout our pedicles.  All instruments and trocars were then removed from the abdomen.  A deep stitch was placed in the infraumbilical incision, and the skin incisions were closed with Vicryl.  Skin glue was placed.  She was extubated and taken to the recovery room in stable condition.  Sponge, lap,  needle, and instrument counts were correct x2.     Zelphia Cairo, MD     GA/MEDQ  D:  04/16/2016  T:  04/16/2016  Job:  [id number]

## 2016-04-16 NOTE — Progress Notes (Signed)
Day of Surgery Procedure(s) (LRB): LAPAROSCOPIC ASSISTED VAGINAL HYSTERECTOMY WITH BILATERAL SALPINGECTOMY (Bilateral) CYSTOSCOPY (N/A)  Subjective: Patient reports incisional pain - pain controlled w/ meds.  C/o itching.  No n/v  Objective: I have reviewed patient's vital signs, intake and output and medications.  General: alert and cooperative GI: normal findings: soft, non-tender  Assessment: s/p Procedure(s): LAPAROSCOPIC ASSISTED VAGINAL HYSTERECTOMY WITH BILATERAL SALPINGECTOMY (Bilateral) CYSTOSCOPY (N/A): stable and progressing well  Plan: Advance diet Encourage ambulation routine postop care     Kaiyan Luczak 04/16/2016, 1:49 PM

## 2016-04-16 NOTE — Anesthesia Postprocedure Evaluation (Signed)
Anesthesia Post Note  Patient: Patricia Levine  Procedure(s) Performed: Procedure(s) (LRB): LAPAROSCOPIC ASSISTED VAGINAL HYSTERECTOMY WITH BILATERAL SALPINGECTOMY (Bilateral) CYSTOSCOPY (N/A)  Patient location during evaluation: PACU Anesthesia Type: General Level of consciousness: awake and alert Pain management: pain level controlled Vital Signs Assessment: post-procedure vital signs reviewed and stable Respiratory status: spontaneous breathing, nonlabored ventilation, respiratory function stable and patient connected to nasal cannula oxygen Cardiovascular status: blood pressure returned to baseline and stable Postop Assessment: no signs of nausea or vomiting Anesthetic complications: no    Last Vitals:  Filed Vitals:   04/16/16 0616 04/16/16 0947  BP: 134/83   Pulse: 61 61  Temp: 36.8 C   Resp: 18     Last Pain: There were no vitals filed for this visit.               Shelonda Saxe S

## 2016-04-16 NOTE — Anesthesia Preprocedure Evaluation (Addendum)

## 2016-04-16 NOTE — Anesthesia Postprocedure Evaluation (Signed)
Anesthesia Post Note  Patient: Patricia Levine  Procedure(s) Performed: Procedure(s) (LRB): LAPAROSCOPIC ASSISTED VAGINAL HYSTERECTOMY WITH BILATERAL SALPINGECTOMY (Bilateral) CYSTOSCOPY (N/A)  Patient location during evaluation: [facility] Anesthesia Type: General Level of consciousness: awake and alert Pain management: satisfactory to patient Vital Signs Assessment: post-procedure vital signs reviewed and stable Respiratory status: spontaneous breathing and respiratory function stable Cardiovascular status: stable Postop Assessment: adequate PO intake Anesthetic complications: no     Last Vitals:  Filed Vitals:   04/16/16 1415 04/16/16 1853  BP: 98/50 117/59  Pulse: 53 47  Temp: 36.9 C 36.7 C  Resp: 20 18    Last Pain:  Filed Vitals:   04/16/16 2104  PainSc: 2    Pain Goal: Patients Stated Pain Goal: 3 (04/16/16 2104)               Karleen DolphinFUSSELL,[place]

## 2016-04-17 ENCOUNTER — Encounter (HOSPITAL_COMMUNITY): Payer: Self-pay | Admitting: Obstetrics and Gynecology

## 2016-04-17 DIAGNOSIS — N920 Excessive and frequent menstruation with regular cycle: Secondary | ICD-10-CM | POA: Diagnosis not present

## 2016-04-17 LAB — CBC
HCT: 31.3 % — ABNORMAL LOW (ref 36.0–46.0)
Hemoglobin: 10.4 g/dL — ABNORMAL LOW (ref 12.0–15.0)
MCH: 31.3 pg (ref 26.0–34.0)
MCHC: 33.2 g/dL (ref 30.0–36.0)
MCV: 94.3 fL (ref 78.0–100.0)
Platelets: 164 10*3/uL (ref 150–400)
RBC: 3.32 MIL/uL — ABNORMAL LOW (ref 3.87–5.11)
RDW: 12.8 % (ref 11.5–15.5)
WBC: 6.2 10*3/uL (ref 4.0–10.5)

## 2016-04-17 MED ORDER — OXYCODONE-ACETAMINOPHEN 5-325 MG PO TABS: 1.0000 | ORAL_TABLET | ORAL | Status: DC | PRN

## 2016-04-17 MED ORDER — IBUPROFEN 600 MG PO TABS: 600.0000 mg | ORAL_TABLET | Freq: Four times a day (QID) | ORAL | Status: DC | PRN

## 2016-04-17 NOTE — Discharge Summary (Signed)
Physician Discharge Summary  Patient ID: Patricia Levine MRN: [id number] DOB/AGE: 08/15/1971 45 y.o.  Admit date: 04/16/2016 Discharge date: 04/17/2016  Admission Diagnoses:  menorrhagia  Discharge Diagnoses:  Active Problems:   Menorrhagia   Discharged Condition: good  Hospital Course: Pt was admitted for postop care.  Initially pain was controlled with IV pain meds.  As her diet was advanced, she was given PO pain meds.  Foley was discontinued and she was able to void and ambulate without difficulty.  Vital signs and labs remained stable.  Consults: None  Significant Diagnostic Studies: labs: cbc  Treatments: surgery: lavh/bs, cystoscopy  Discharge Exam: Blood pressure 104/51, pulse 62, temperature 98.9 F (37.2 C), temperature source Oral, resp. rate 18, height 5\' 8"  (1.727 m), weight 141 lb (63.957 kg), last menstrual period 04/08/2016, SpO2 98 %. General appearance: alert and cooperative GI: normal findings: soft, non-tender Incision/Wound:c/d/i  Disposition: 01-Home or Self Care     Medication List    TAKE these medications        Cranberry 500 MG Caps  Take 1 capsule by mouth daily.     dicyclomine 20 MG tablet  Commonly known as:  BENTYL  Take 1 tablet (20 mg total) by mouth 2 (two) times daily.     fluticasone 50 MCG/ACT nasal spray  Commonly known as:  FLONASE  INSTILL 2 SPRAYS INTO EACH NOSTRIL DAILY     ibuprofen 600 MG tablet  Commonly known as:  ADVIL  Take 1 tablet (600 mg total) by mouth every 6 (six) hours as needed for moderate pain.     meloxicam 15 MG tablet  Commonly known as:  MOBIC  Take one tablet daily for 5 days, then take as needed. Take with food.     multivitamin tablet  Take 1 tablet by mouth daily.     oxyCODONE-acetaminophen 5-325 MG tablet  Commonly known as:  ROXICET  Take 1-2 tablets by mouth every 4 (four) hours as needed for severe pain.     vitamin E 400 UNIT capsule  Take 400 Units by mouth daily.            Follow-up Information    Schedule an appointment as soon as possible for a visit in 2 weeks to follow up.      Signed: Yassir Enis 04/17/2016, 8:33 AM

## 2016-04-17 NOTE — Progress Notes (Signed)
Pt d/c's home and verbalizes and understanding of d/c instruction, medications, follow-up appts, and when to seek medical attention. IV was d/c'd by nurse tech without complications. Patient was given a copy of discharge instructions and prescription. Pt has no questions at this time. Pt ambulated to main entrance. Accompanied by RN. Pt husband will be driving her home and helped pt into car. Patricia DaneERRI L Maxwel Meadowcroft, RN

## 2016-11-04 ENCOUNTER — Ambulatory Visit (INDEPENDENT_AMBULATORY_CARE_PROVIDER_SITE_OTHER): Payer: BLUE CROSS/BLUE SHIELD | Admitting: Sports Medicine

## 2016-11-04 ENCOUNTER — Encounter: Payer: Self-pay | Admitting: Sports Medicine

## 2016-11-04 VITALS — BP 148/80 | Ht 68.0 in | Wt 148.0 lb

## 2016-11-04 DIAGNOSIS — M79661 Pain in right lower leg: Secondary | ICD-10-CM

## 2016-11-04 NOTE — Progress Notes (Signed)
  Patricia Levine - 45 y.o. female MRN [id number]  Date of birth: [birth year]  CC: Right calf pain  SUBJECTIVE:   Patricia Levine is a 45 yo woman presenting with 2 months of right calf pain. She reports the pain first started mid-September while running. She felt a pulling/pain along lateral aspect and had to stop running. Worsened afterwards and was unable to walk normal for 2-3 days. Had swelling and bruising of the area. Stopped running for two weeks and used ice and compression. Started back running end of Sept. and felt a pop in the same area with return of pain and discoloration. Third episode last week while walking on beach when she stepped into a hole and felt a tightness along her calf. This was less severe and has resolved.  The pain does not affect normal activities and is only exacerbated with running. She has been walking and doing heel and toe raises without pain.  ROS: No weakness or numbness No nighttime symptoms  PHYSICAL EXAM:  VS: BP:(!) 148/80  HT:5\' 8"  (172.7 cm)   WT:148 lb (67.1 kg)  BMI:22.6   Well appearing female in no acute distress  Right calf: No swelling, erythema, or obvious deformity Non tender to palpation. Full ROM 5/5 knee flexion and extension and 5/5 dorsi and plantar flexion Able to do 10 single heel raises without pain No pain when stretching gastroc but reproducible tightness when doing soleus stretching  Right leg 1cm longer than left  Ultrasound R calf: Limited images of the right calf were obtained. There appears to be an area of calcification along the lateral most aspect of the lateral head of the gastrocnemius about two thirds of the way down the muscle belly. This is best seen on the longitudinal view.   ASSESSMENT & PLAN:  1. Right calf pain:  - 3/16" heel lift on left  - Can continue heel and toe raises, but avoid stretching calf muscles too much - Can return to activity as tolerated by pain. Limit to no/very mild discomfort - Return in 1  month for follow up and repeat   Patient seen and evaluated with the medical student. I agree with the above plan of care. Patient appears to have an area of calcification within the lateral head of the gastrocnemius. This is likely a posttraumatic calcification in an area of a previous tear. It was also noted on her physical exam that her right leg was 1 cm longer than her left. This could also been noted on an exam done a few years ago by Dr. Darrick Pennafields. We will give her a 3/16 inch lift to wear in her left shoe. She may continue to increase activity as tolerated but needs to be patient with this injury. Follow-up in one month for reevaluation.

## 2016-11-07 DIAGNOSIS — Z1211 Encounter for screening for malignant neoplasm of colon: Secondary | ICD-10-CM | POA: Diagnosis not present

## 2016-11-07 DIAGNOSIS — Z800 Family history of malignant neoplasm of digestive organs: Secondary | ICD-10-CM | POA: Diagnosis not present

## 2016-12-02 ENCOUNTER — Ambulatory Visit (INDEPENDENT_AMBULATORY_CARE_PROVIDER_SITE_OTHER): Payer: BLUE CROSS/BLUE SHIELD | Admitting: Sports Medicine

## 2016-12-02 ENCOUNTER — Ambulatory Visit
Admission: RE | Admit: 2016-12-02 | Discharge: 2016-12-02 | Disposition: A | Discharge status: Home / Self Care | Payer: BLUE CROSS/BLUE SHIELD | Source: Ambulatory Visit | Attending: Sports Medicine | Admitting: Sports Medicine

## 2016-12-02 ENCOUNTER — Encounter: Payer: Self-pay | Admitting: Sports Medicine

## 2016-12-02 ENCOUNTER — Ambulatory Visit: Payer: Self-pay

## 2016-12-02 VITALS — BP 143/69 | Ht 68.0 in | Wt 148.0 lb

## 2016-12-02 DIAGNOSIS — M79604 Pain in right leg: Secondary | ICD-10-CM

## 2016-12-02 DIAGNOSIS — M79661 Pain in right lower leg: Secondary | ICD-10-CM

## 2016-12-02 NOTE — Progress Notes (Signed)
   Subjective:    Patient ID: Patricia Levine, female    DOB: 08/19/1971, 45 y.o.   MRN: [id number]  HPI   Patient comes in today for follow-up on a right calf strain. Overall, her symptoms are similar. She has not improved much. She is able to walk without any pain but as soon as she tries to run, she gets a feeling of tightness along the right lateral calf. She has been wearing her heel lift on the left. She has been doing her home exercises. Previous ultrasound showed a probable calcification within the area of the lateral gastrocnemius.    Review of Systems     Objective:   Physical Exam  Well-developed, well-nourished. No acute distress  Right calf: There is slight tenderness to palpation along the lateral distal gastrocnemius. There is no palpable calcification. No defect. No soft tissue swelling. No pain with Achilles stretching. Good strength. Neurovascularly intact distally. Patient walks without a limp.   MSK ultrasound of the right calf was performed. Images were obtained and compared to her previous ultrasound. Once again seen is an area of calcification in the posterior lateral distal right gastrocnemius. It is difficult for me to tell whether or not this originates from the fibula.      Assessment & Plan:   Persistent right calf pain with ultrasound evidence of calcification within the muscle belly  X-rays to rule out myositis ossificans. If unremarkable, we will proceed with an MRI scan to rule out a possible calf tear or hematoma that may be responsible for her symptoms. Phone follow-up after those studies to delineate further treatment. In the meantime, she will continue to exercise using pain as her guide.

## 2016-12-03 ENCOUNTER — Telehealth: Payer: Self-pay | Admitting: Sports Medicine

## 2016-12-03 NOTE — Telephone Encounter (Signed)
  I spoke with Patricia Levine today on the phone after reviewing the x-ray of her lower leg. 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 It is possible that it is too early for it to show up on plain x-ray. At this point in time, I think we can cancel her MRI and simply have her follow-up with me again in 4-6 weeks for repeat ultrasound. I think she can start to increase her running as tolerated but I want her to first start with a run/walk program every other day. She is currently exercising multiple days in a row which I think is keeping this condition aggravated. I also want her to bring her running shoes to her follow-up visit so that I may do a gait analysis.

## 2016-12-05 ENCOUNTER — Other Ambulatory Visit: Payer: BLUE CROSS/BLUE SHIELD

## 2016-12-06 ENCOUNTER — Inpatient Hospital Stay: Admission: RE | Admit: 2016-12-06 | Payer: BLUE CROSS/BLUE SHIELD | Source: Ambulatory Visit

## 2016-12-27 DIAGNOSIS — R350 Frequency of micturition: Secondary | ICD-10-CM | POA: Diagnosis not present

## 2016-12-27 DIAGNOSIS — R030 Elevated blood-pressure reading, without diagnosis of hypertension: Secondary | ICD-10-CM | POA: Diagnosis not present

## 2016-12-27 DIAGNOSIS — Z6824 Body mass index (BMI) 24.0-24.9, adult: Secondary | ICD-10-CM | POA: Diagnosis not present

## 2016-12-27 DIAGNOSIS — R300 Dysuria: Secondary | ICD-10-CM | POA: Diagnosis not present

## 2016-12-27 DIAGNOSIS — N390 Urinary tract infection, site not specified: Secondary | ICD-10-CM | POA: Diagnosis not present

## 2017-01-07 ENCOUNTER — Encounter: Payer: Self-pay | Admitting: Sports Medicine

## 2017-01-07 ENCOUNTER — Ambulatory Visit: Payer: Self-pay

## 2017-01-07 ENCOUNTER — Ambulatory Visit (INDEPENDENT_AMBULATORY_CARE_PROVIDER_SITE_OTHER): Payer: BLUE CROSS/BLUE SHIELD | Admitting: Sports Medicine

## 2017-01-07 VITALS — BP 142/70 | Ht 68.0 in | Wt 150.0 lb

## 2017-01-07 DIAGNOSIS — M79661 Pain in right lower leg: Secondary | ICD-10-CM | POA: Diagnosis not present

## 2017-01-07 NOTE — Progress Notes (Signed)
    Subjective:  Patricia Levine is a 46 y.o. female who [facility] to the [facility] today with a chief complaint of right calf pain.   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 Over the past month, she has gradually increased her activity level. She has been without pain over that time. She able to [employer] without pain 2 days ago. She has been doing her home exercise program.  ROS: Per HPI  Objective:  Physical Exam: There were no vitals taken for this visit.  Gen: NAD, resting comfortably MSK:  -R Calf: No deformities. Nontender to palpation. FROM. No pain with heel raises.  -Gait: Normal  Bedside US: US of right calf without significant areas of calcification. There may be a small area of residual calcification on the proximal lateral head of the gastrocnemius  Assessment/Plan:  Right Calf Strain Improving. Ultrasound without significant calcifications. Will continue home exercise program. Continue gradually increasing activity level as tolerated. Follow up in 4 weeks.   Katina Degreealeb M. Jimmey RalphParker, MD [facility] Resident PGY-3 01/07/2017 10:00 AM   Patient seen and evaluated with the resident. I agree with the above plan of care. Today's repeat ultrasound shows very little evidence of her prior myositis ossificans. 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 Clinically, the patient is doing much better. I think she may continue to increase activity slowly as tolerated. I recommended that she continue with her compression sleeve for the immediate future with exercise. Continue with her home exercises as well. Follow-up as needed.

## 2017-02-25 DIAGNOSIS — D2262 Melanocytic nevi of left upper limb, including shoulder: Secondary | ICD-10-CM | POA: Diagnosis not present

## 2017-02-25 DIAGNOSIS — D2271 Melanocytic nevi of right lower limb, including hip: Secondary | ICD-10-CM | POA: Diagnosis not present

## 2017-02-25 DIAGNOSIS — D225 Melanocytic nevi of trunk: Secondary | ICD-10-CM | POA: Diagnosis not present

## 2017-02-25 DIAGNOSIS — D2261 Melanocytic nevi of right upper limb, including shoulder: Secondary | ICD-10-CM | POA: Diagnosis not present

## 2017-04-03 DIAGNOSIS — Z01419 Encounter for gynecological examination (general) (routine) without abnormal findings: Secondary | ICD-10-CM | POA: Diagnosis not present

## 2017-04-03 DIAGNOSIS — Z6824 Body mass index (BMI) 24.0-24.9, adult: Secondary | ICD-10-CM | POA: Diagnosis not present

## 2017-04-03 DIAGNOSIS — Z1231 Encounter for screening mammogram for malignant neoplasm of breast: Secondary | ICD-10-CM | POA: Diagnosis not present

## 2017-05-14 ENCOUNTER — Ambulatory Visit (INDEPENDENT_AMBULATORY_CARE_PROVIDER_SITE_OTHER): Payer: BLUE CROSS/BLUE SHIELD | Admitting: Sports Medicine

## 2017-05-14 ENCOUNTER — Encounter: Payer: Self-pay | Admitting: Sports Medicine

## 2017-05-14 DIAGNOSIS — M7741 Metatarsalgia, right foot: Secondary | ICD-10-CM | POA: Diagnosis not present

## 2017-05-14 DIAGNOSIS — M79661 Pain in right lower leg: Secondary | ICD-10-CM

## 2017-05-14 NOTE — Progress Notes (Signed)
   Subjective:    Patient ID: Patricia Levine, female    DOB: 1971-12-17, 46 y.o.   MRN: [id number]  HPI chief complaint: Right calf pain  Patient presents today with returning right calf pain. She has a history of right sided calf strains in the past. Her last injury was this past winter. She actually had an area of calcification concerning for myositis ossificans on her original ultrasound but this was not confirmed on x-ray. That area of calcification eventually resolved. She was able to increase her mileage responsibly (she showed me her running log) but once she gets up to between 5 and 6 miles she has returning pain and occasional swelling along the lateral right calf. She does not recall any specific injury (which was the case with her last injury). But she is concerned about her returning symptoms as well as possible persistent calcification within the muscle belly. She localizes her pain to the distal right lateral calf. It is tender to palpation at times. She has had to adjust her running. She denies numbness or tingling. She has been compliant with her home exercises and she wears her calf sleeve when running.    Review of Systems    as above Objective:   Physical Exam  Well-developed, fit appearing. No acute distress. Awake alert and oriented 3. Vital signs reviewed  Right calf: There is a focal area of the distal lateral calf that is tender to palpation. No palpable defect. No soft tissue swelling. Remainder of her calf is nontender to palpation. She is neurovascular intact distally. She has excellent hip strength and quad strength bilaterally.  Evaluation of her running gait shows her to be a midfoot Striker landing in pronation on the right. No limp. Good running form.  MSK ultrasound of the right calf was performed. Limited images were obtained. Over the area of focal tenderness there is seen a small area of hypoechoic change at the fascial plane of the gastrocnemius and soleus.  There is no obvious myositis ossificans. Findings are consistent with a lateral gastroc calf strain.       Assessment & Plan:   Returning right calf pain secondary to gastroc strain  The patient's symptoms seem to occur on her longer runs so I think there is an element of fatigue here. However, her strength is very good. I question whether or not her pronation is playing a role in her reoccurring injuries. She was also given a small heel lift for her left shoe at some point and I wonder whether or not that too is contributing to her reinjury. I would like to start with replacing her current inserts with a green sports inserts with bilateral heel lifts and scaphoid pads. She has a history of metatarsalgia so we will add a metatarsal pad to the right insert as well. Patient will follow-up with me in 3-4 weeks for custom orthotics. She is asking about the possibility of dry needling and I think that is okay for her to try if she would like. I also think she can continue with activity using pain as her guide but will continue wearing her compression sleeve when active. Reassured that I see no evidence of myositis ossificans on her ultrasound.  Total time spent with the patient was 30 minutes with greater than 50% of the time spent in face-to-face consultation discussing her diagnosis, performing an ultrasound, and designing her treatment plan.

## 2017-05-22 DIAGNOSIS — M79661 Pain in right lower leg: Secondary | ICD-10-CM | POA: Diagnosis not present

## 2017-06-03 DIAGNOSIS — M79661 Pain in right lower leg: Secondary | ICD-10-CM | POA: Diagnosis not present

## 2017-06-09 ENCOUNTER — Encounter: Payer: Self-pay | Admitting: Sports Medicine

## 2017-06-09 ENCOUNTER — Ambulatory Visit (INDEPENDENT_AMBULATORY_CARE_PROVIDER_SITE_OTHER): Payer: BLUE CROSS/BLUE SHIELD | Admitting: Sports Medicine

## 2017-06-09 VITALS — BP 130/70 | Ht 68.0 in | Wt 158.0 lb

## 2017-06-09 DIAGNOSIS — M216X2 Other acquired deformities of left foot: Secondary | ICD-10-CM | POA: Diagnosis not present

## 2017-06-09 DIAGNOSIS — M216X1 Other acquired deformities of right foot: Secondary | ICD-10-CM

## 2017-06-09 NOTE — Progress Notes (Signed)
  Patricia Levine comes in today for custom orthotics. Please see her last office note for details regarding history and physical exam findings. In short, she has a chronic calf strain. It was noted on a previous exam that she pronates with running. She has done well with temporary green sports insoles and scaphoid pads. Therefore, we will proceed with custom orthotics today.  Custom orthotics were created. Gait was neutral with orthotics in place. She found them to be comfortable. She may continue with activity as tolerated and follow-up with me as needed. Total time spent with the patient was 30 minutes with greater than 50% of the time spent in face-to-face consultation discussing orthotic construction, instruction, and fitting.  Patient was fitted for a : standard, cushioned, semi-rigid orthotic. The orthotic was heated and afterward the patient stood on the orthotic blank positioned on the orthotic stand. The patient was positioned in subtalar neutral position and 10 degrees of ankle dorsiflexion in a weight bearing stance. After completion of molding, a stable base was applied to the orthotic blank. The blank was ground to a stable position for weight bearing. Size: 8 Base: Blue EVA Posting: none Additional orthotic padding: metatarsal pad on the right

## 2017-08-27 DIAGNOSIS — Z114 Encounter for screening for human immunodeficiency virus [HIV]: Secondary | ICD-10-CM | POA: Diagnosis not present

## 2017-08-27 DIAGNOSIS — Z0000 Encounter for general adult medical examination without abnormal findings: Secondary | ICD-10-CM | POA: Diagnosis not present

## 2017-08-27 DIAGNOSIS — Z1329 Encounter for screening for other suspected endocrine disorder: Secondary | ICD-10-CM | POA: Diagnosis not present

## 2017-08-27 DIAGNOSIS — Z13220 Encounter for screening for lipoid disorders: Secondary | ICD-10-CM | POA: Diagnosis not present

## 2017-08-29 DIAGNOSIS — Z0000 Encounter for general adult medical examination without abnormal findings: Secondary | ICD-10-CM | POA: Diagnosis not present

## 2017-08-29 DIAGNOSIS — F419 Anxiety disorder, unspecified: Secondary | ICD-10-CM | POA: Diagnosis not present

## 2017-08-29 DIAGNOSIS — Z6823 Body mass index (BMI) 23.0-23.9, adult: Secondary | ICD-10-CM | POA: Diagnosis not present

## 2017-08-29 DIAGNOSIS — Z23 Encounter for immunization: Secondary | ICD-10-CM | POA: Diagnosis not present

## 2017-10-30 ENCOUNTER — Encounter: Payer: Self-pay | Admitting: Sports Medicine

## 2017-10-30 ENCOUNTER — Ambulatory Visit
Admission: RE | Admit: 2017-10-30 | Discharge: 2017-10-30 | Disposition: A | Discharge status: Home / Self Care | Payer: BLUE CROSS/BLUE SHIELD | Source: Ambulatory Visit | Attending: Sports Medicine | Admitting: Sports Medicine

## 2017-10-30 ENCOUNTER — Ambulatory Visit (INDEPENDENT_AMBULATORY_CARE_PROVIDER_SITE_OTHER): Payer: BLUE CROSS/BLUE SHIELD | Admitting: Sports Medicine

## 2017-10-30 VITALS — BP 148/70 | Ht 68.0 in | Wt 155.0 lb

## 2017-10-30 DIAGNOSIS — M79674 Pain in right toe(s): Secondary | ICD-10-CM | POA: Diagnosis not present

## 2017-10-30 NOTE — Addendum Note (Signed)
Addended by: Rutha BouchardBABNIK, Tonye Tancredi E on: 10/30/2017 02:14 PM   Modules accepted: Orders

## 2017-10-30 NOTE — Progress Notes (Signed)
   Subjective:    Patient ID: Patricia Levine, female    DOB: 03/17/1971, 46 y.o.   MRN: [id number]  HPI chief complaint: Right foot pain  Patricia Levine comes in today complaining of 1 month of right foot pain that she localizes around the first MTP joint. She denies any trauma but rather describes a gradual onset of pain that was initially present with running but is now to the point to where she is having pain when walking barefoot. Pain is diffuse around the first MTP joint. She's not noticed any significant swelling but will occasionally get numbness and tingling into her toe. She has custom orthotics which were recently created. They have a metatarsal pad in place but her current pain is different in nature than what she has experienced previously.    Review of Systems    as above Objective:   Physical Exam  Well-developed, well-nourished. No acute distress. Awake alert and oriented 3. Vital signs reviewed  Right foot: There is some mild diffuse swelling around the first MTP joint. Limited active and passive range of motion. Diffuse tenderness to palpation including over the sesamoids. Pain with active extension and flexion of the great toe. No erythema. Joint is not warm to touch. Neurovascularly intact distally. Walking without a significant limp.  X-rays of her right great toe including AP, lateral, and oblique views show evidence of a possible stress fracture through the medial sesamoid versus a bipartite sesamoid. There are minimal degenerative changes at the first MTP joint      Assessment & Plan:   Right foot pain with x-ray evidence of possible medial sesamoid stress fracture  MRI of the right foot for definitive diagnosis. I did add a first ray post to her custom orthotics and gave her an additional pair of green off-the-shelf orthotics to which we added a metatarsal pad and a first ray post. She may experiment with both sets of inserts and wear whichever ones are more comfortable.  Phone follow-up after I review her MRI for more definitive treatment.

## 2017-11-07 ENCOUNTER — Ambulatory Visit
Admission: RE | Admit: 2017-11-07 | Discharge: 2017-11-07 | Disposition: A | Discharge status: Home / Self Care | Payer: BLUE CROSS/BLUE SHIELD | Source: Ambulatory Visit | Attending: Sports Medicine | Admitting: Sports Medicine

## 2017-11-07 DIAGNOSIS — M79674 Pain in right toe(s): Secondary | ICD-10-CM

## 2017-11-07 DIAGNOSIS — M25471 Effusion, right ankle: Secondary | ICD-10-CM | POA: Diagnosis not present

## 2017-11-11 ENCOUNTER — Other Ambulatory Visit: Payer: BLUE CROSS/BLUE SHIELD

## 2017-11-11 ENCOUNTER — Telehealth: Payer: Self-pay | Admitting: Sports Medicine

## 2017-11-11 NOTE — Telephone Encounter (Signed)
  I spoke with the patient on the phone today after reviewing the MRI of her right foot. She has findings consistent with sesamoiditis. No obvious stress fracture although she does have sclerosis through the lateral sesamoid consistent with chronic sesamoiditis. She will return to the office later today to be fitted with a dancer's pad. She may utilize icing and Aleve as needed and will follow-up with me in 3-4 weeks for reevaluation. If symptoms persist I may elicit the input of Dr. Victorino DikeHewitt.

## 2017-12-11 ENCOUNTER — Ambulatory Visit (INDEPENDENT_AMBULATORY_CARE_PROVIDER_SITE_OTHER): Payer: BLUE CROSS/BLUE SHIELD | Admitting: Sports Medicine

## 2017-12-11 DIAGNOSIS — M79674 Pain in right toe(s): Secondary | ICD-10-CM | POA: Diagnosis not present

## 2017-12-11 DIAGNOSIS — M2580 Other specified joint disorders, unspecified joint: Secondary | ICD-10-CM

## 2017-12-11 MED ORDER — MELOXICAM 15 MG PO TABS: ORAL_TABLET | ORAL | 0 refills | Status: DC

## 2017-12-12 NOTE — Progress Notes (Signed)
   Subjective:    Patient ID: Patricia Levine, female    DOB: 18-Sep-1971, 46 y.o.   MRN: [id number]  HPI   Patient comes in today for follow-up on right foot sesamoiditis. Symptoms are improving albeit slowly. Dancer's pad has been comfortable. She has not been able to return to running but has been able to do some recreational walking. Still pain when walking barefoot or with wearing heels.   Review of Systems    as above Objective:   Physical Exam  Well-developed, well-nourished. No acute distress  Right foot: Examination of the right foot with attention to the right great toe still shows some tenderness to palpation over the sesamoid bones, especially the lateral sesamoid. No soft tissue swelling. No effusion of the MTP joint. Good active and passive range of motion. Neurovascularly intact distally. She walks without a limp.      Assessment & Plan:   Right foot pain secondary to sesamoiditis  Patient is one month into treatment. She is slowly improving. We've given her additional dancer pads to put in other shoes. Follow-up with me in another 4 weeks. She is not yet ready to return to running. Call with questions or concerns prior to follow-up.

## 2018-01-08 ENCOUNTER — Ambulatory Visit: Payer: BLUE CROSS/BLUE SHIELD | Admitting: Sports Medicine

## 2018-02-25 DIAGNOSIS — D2261 Melanocytic nevi of right upper limb, including shoulder: Secondary | ICD-10-CM | POA: Diagnosis not present

## 2018-02-25 DIAGNOSIS — D2271 Melanocytic nevi of right lower limb, including hip: Secondary | ICD-10-CM | POA: Diagnosis not present

## 2018-02-25 DIAGNOSIS — D2262 Melanocytic nevi of left upper limb, including shoulder: Secondary | ICD-10-CM | POA: Diagnosis not present

## 2018-02-25 DIAGNOSIS — D225 Melanocytic nevi of trunk: Secondary | ICD-10-CM | POA: Diagnosis not present

## 2018-05-11 DIAGNOSIS — Z01419 Encounter for gynecological examination (general) (routine) without abnormal findings: Secondary | ICD-10-CM | POA: Diagnosis not present

## 2018-05-11 DIAGNOSIS — Z1231 Encounter for screening mammogram for malignant neoplasm of breast: Secondary | ICD-10-CM | POA: Diagnosis not present

## 2018-05-11 DIAGNOSIS — Z131 Encounter for screening for diabetes mellitus: Secondary | ICD-10-CM | POA: Diagnosis not present

## 2018-05-11 DIAGNOSIS — Z6822 Body mass index (BMI) 22.0-22.9, adult: Secondary | ICD-10-CM | POA: Diagnosis not present

## 2018-05-26 DIAGNOSIS — E119 Type 2 diabetes mellitus without complications: Secondary | ICD-10-CM | POA: Diagnosis not present

## 2018-08-31 DIAGNOSIS — Z114 Encounter for screening for human immunodeficiency virus [HIV]: Secondary | ICD-10-CM | POA: Diagnosis not present

## 2018-08-31 DIAGNOSIS — Z13220 Encounter for screening for lipoid disorders: Secondary | ICD-10-CM | POA: Diagnosis not present

## 2018-08-31 DIAGNOSIS — Z0000 Encounter for general adult medical examination without abnormal findings: Secondary | ICD-10-CM | POA: Diagnosis not present

## 2018-08-31 DIAGNOSIS — Z1329 Encounter for screening for other suspected endocrine disorder: Secondary | ICD-10-CM | POA: Diagnosis not present

## 2018-09-02 DIAGNOSIS — Z0000 Encounter for general adult medical examination without abnormal findings: Secondary | ICD-10-CM | POA: Diagnosis not present

## 2018-09-02 DIAGNOSIS — Z23 Encounter for immunization: Secondary | ICD-10-CM | POA: Diagnosis not present

## 2018-09-02 DIAGNOSIS — Z6823 Body mass index (BMI) 23.0-23.9, adult: Secondary | ICD-10-CM | POA: Diagnosis not present

## 2019-03-01 DIAGNOSIS — L710 Perioral dermatitis: Secondary | ICD-10-CM | POA: Diagnosis not present

## 2019-03-01 DIAGNOSIS — D225 Melanocytic nevi of trunk: Secondary | ICD-10-CM | POA: Diagnosis not present

## 2019-03-01 DIAGNOSIS — D2261 Melanocytic nevi of right upper limb, including shoulder: Secondary | ICD-10-CM | POA: Diagnosis not present

## 2019-03-01 DIAGNOSIS — D2262 Melanocytic nevi of left upper limb, including shoulder: Secondary | ICD-10-CM | POA: Diagnosis not present

## 2019-08-23 DIAGNOSIS — Z01419 Encounter for gynecological examination (general) (routine) without abnormal findings: Secondary | ICD-10-CM | POA: Diagnosis not present

## 2019-08-23 DIAGNOSIS — Z6823 Body mass index (BMI) 23.0-23.9, adult: Secondary | ICD-10-CM | POA: Diagnosis not present

## 2019-08-23 DIAGNOSIS — Z1231 Encounter for screening mammogram for malignant neoplasm of breast: Secondary | ICD-10-CM | POA: Diagnosis not present

## 2019-10-08 DIAGNOSIS — Z0000 Encounter for general adult medical examination without abnormal findings: Secondary | ICD-10-CM | POA: Diagnosis not present

## 2019-10-08 DIAGNOSIS — E782 Mixed hyperlipidemia: Secondary | ICD-10-CM | POA: Diagnosis not present

## 2019-10-13 DIAGNOSIS — Z6824 Body mass index (BMI) 24.0-24.9, adult: Secondary | ICD-10-CM | POA: Diagnosis not present

## 2019-10-13 DIAGNOSIS — Z0000 Encounter for general adult medical examination without abnormal findings: Secondary | ICD-10-CM | POA: Diagnosis not present

## 2019-11-02 DIAGNOSIS — E119 Type 2 diabetes mellitus without complications: Secondary | ICD-10-CM | POA: Diagnosis not present

## 2019-11-04 DIAGNOSIS — Z23 Encounter for immunization: Secondary | ICD-10-CM | POA: Diagnosis not present

## 2019-11-07 DIAGNOSIS — Z20828 Contact with and (suspected) exposure to other viral communicable diseases: Secondary | ICD-10-CM | POA: Diagnosis not present

## 2020-03-02 DIAGNOSIS — D2262 Melanocytic nevi of left upper limb, including shoulder: Secondary | ICD-10-CM | POA: Diagnosis not present

## 2020-03-02 DIAGNOSIS — L821 Other seborrheic keratosis: Secondary | ICD-10-CM | POA: Diagnosis not present

## 2020-03-02 DIAGNOSIS — D225 Melanocytic nevi of trunk: Secondary | ICD-10-CM | POA: Diagnosis not present

## 2020-03-02 DIAGNOSIS — L573 Poikiloderma of Civatte: Secondary | ICD-10-CM | POA: Diagnosis not present

## 2020-04-27 DIAGNOSIS — N390 Urinary tract infection, site not specified: Secondary | ICD-10-CM | POA: Diagnosis not present

## 2020-08-22 ENCOUNTER — Other Ambulatory Visit: Payer: Self-pay

## 2020-08-22 ENCOUNTER — Encounter: Payer: Self-pay | Admitting: Sports Medicine

## 2020-08-22 ENCOUNTER — Ambulatory Visit: Payer: BC Managed Care – PPO | Admitting: Sports Medicine

## 2020-08-22 VITALS — BP 148/68 | Ht 68.0 in | Wt 150.0 lb

## 2020-08-22 DIAGNOSIS — M79672 Pain in left foot: Secondary | ICD-10-CM

## 2020-08-22 NOTE — Progress Notes (Signed)
Patient ID: Patricia Levine, female   DOB: [birth year], 49 y.o.   MRN: [id number]  Patricia Levine comes in today for new orthotics.  Last set of orthotics were made in 2018.  She is complaining today of some left heel pain that began about a month ago.  She is hoping that new orthotics will help with this.  New orthotics were constructed today.  She found them to be comfortable prior to leaving the office.  She was shown some plantar fascial stretching exercises to start doing daily.  If heel pain persists, return to the office for a more complete evaluation and work-up.  Follow-up as needed.  Patient was fitted for a : standard, cushioned, semi-rigid orthotic. The orthotic was heated and afterward the patient stood on the orthotic blank positioned on the orthotic stand. The patient was positioned in subtalar neutral position and 10 degrees of ankle dorsiflexion in a weight bearing stance. After completion of molding, a stable base was applied to the orthotic blank. The blank was ground to a stable position for weight bearing. Size: 8 Base: Blue EVA Posting: first ray post, right Additional orthotic padding: Dancer's pad, right

## 2020-09-12 DIAGNOSIS — Z01419 Encounter for gynecological examination (general) (routine) without abnormal findings: Secondary | ICD-10-CM | POA: Diagnosis not present

## 2020-09-12 DIAGNOSIS — Z6823 Body mass index (BMI) 23.0-23.9, adult: Secondary | ICD-10-CM | POA: Diagnosis not present

## 2020-10-31 ENCOUNTER — Other Ambulatory Visit: Payer: Self-pay

## 2020-10-31 ENCOUNTER — Ambulatory Visit: Payer: BC Managed Care – PPO | Admitting: Sports Medicine

## 2020-10-31 VITALS — BP 118/78 | Ht 68.0 in | Wt 141.0 lb

## 2020-10-31 DIAGNOSIS — M722 Plantar fascial fibromatosis: Secondary | ICD-10-CM | POA: Diagnosis not present

## 2020-10-31 DIAGNOSIS — M79672 Pain in left foot: Secondary | ICD-10-CM | POA: Diagnosis not present

## 2020-10-31 MED ORDER — MELOXICAM 15 MG PO TABS: ORAL_TABLET | ORAL | 0 refills | Status: DC

## 2020-10-31 NOTE — Patient Instructions (Signed)
  Purchase the [employer] and wear it at night. I have prescribed you some meloxicam with instructions to take one 15 mg tablet daily with food.  Try it for a week. I have also given you a prescription for physical therapy. Go to [facility] imaging and get an x-ray of your heel. Follow-up with me in 6 weeks for reevaluation.

## 2020-11-01 NOTE — Progress Notes (Signed)
   Subjective:    Patient ID: Patricia Levine, female    DOB: 1971-04-03, 49 y.o.   MRN: [id number]  HPI chief complaint: Left heel pain  Korena comes in today with persistent left heel pain.  She was last seen in the office in August when custom orthotics were made for her.  She was exhibiting early signs of plantar fasciitis and was instructed to start plantar fascial stretching and icing which she did.  Despite this, her pain has worsened.  She is unable to run due to her pain.  Pain is also severe first thing in the morning.  She is able to do the elliptical but it is somewhat uncomfortable.  She feels like the heel of the left orthotic is a little bit harder than the right.  She localizes all of her pain to the plantar aspect of the calcaneus.  Denies any swelling.  No previous history of plantar fasciitis.  Interim medical history reviewed Medications reviewed Allergies reviewed    Review of Systems    As above Objective:   Physical Exam  Well-developed, well-nourished.  No acute distress  Left heel: There is tenderness to palpation at the calcaneal origin of the plantar fascia.  No soft tissue swelling.  Negative calcaneal squeeze.  No other bony or soft tissue tenderness to direct palpation.  Neurovascularly intact distally.  Limited MSK of the left heel was performed and compared to the uninvolved right heel.  There is significant thickening and swelling at the calcaneal origin of the plantar fascia on the left.  Right plantar fascia is normal.  Findings are consistent with plantar fasciitis and concomitant edema.      Assessment & Plan:   Left heel pain secondary to plantar fasciitis  Given the edema seen on her ultrasound, I would like to start meloxicam 15 mg daily for the next 7 days.  She may then take it as needed afterwards.  I have also recommended a [place] sock at night.  I did modify her left orthotic just a bit by shaving down the heel but I suspect that the  discomfort she is getting is from her plantar fasciitis and not the orthotic itself.  I recommended formal physical therapy and a follow-up visit with me in 6 weeks.  Hopefully she will start to turn the corner soon.  Call with questions or concerns in the interim. Of note, she was asking about the possibility of laser treatment through a local chiropractic office.  I do not think it will cause any harm so she may try this if she would like.

## 2020-11-07 DIAGNOSIS — Z1231 Encounter for screening mammogram for malignant neoplasm of breast: Secondary | ICD-10-CM | POA: Diagnosis not present

## 2020-11-15 DIAGNOSIS — Z13220 Encounter for screening for lipoid disorders: Secondary | ICD-10-CM | POA: Diagnosis not present

## 2020-11-15 DIAGNOSIS — Z0000 Encounter for general adult medical examination without abnormal findings: Secondary | ICD-10-CM | POA: Diagnosis not present

## 2020-11-15 DIAGNOSIS — Z1159 Encounter for screening for other viral diseases: Secondary | ICD-10-CM | POA: Diagnosis not present

## 2020-11-21 DIAGNOSIS — E119 Type 2 diabetes mellitus without complications: Secondary | ICD-10-CM | POA: Diagnosis not present

## 2020-11-22 DIAGNOSIS — Z1211 Encounter for screening for malignant neoplasm of colon: Secondary | ICD-10-CM | POA: Diagnosis not present

## 2020-11-22 DIAGNOSIS — R739 Hyperglycemia, unspecified: Secondary | ICD-10-CM | POA: Diagnosis not present

## 2020-11-22 DIAGNOSIS — Z23 Encounter for immunization: Secondary | ICD-10-CM | POA: Diagnosis not present

## 2020-11-22 DIAGNOSIS — F40243 Fear of flying: Secondary | ICD-10-CM | POA: Diagnosis not present

## 2020-11-22 DIAGNOSIS — M722 Plantar fascial fibromatosis: Secondary | ICD-10-CM | POA: Diagnosis not present

## 2020-11-22 DIAGNOSIS — Z0000 Encounter for general adult medical examination without abnormal findings: Secondary | ICD-10-CM | POA: Diagnosis not present

## 2020-11-23 ENCOUNTER — Ambulatory Visit: Payer: BC Managed Care – PPO | Attending: Sports Medicine | Admitting: Physical Therapy

## 2020-11-23 ENCOUNTER — Encounter: Payer: Self-pay | Admitting: Physical Therapy

## 2020-11-23 ENCOUNTER — Other Ambulatory Visit: Payer: Self-pay

## 2020-11-23 DIAGNOSIS — M25572 Pain in left ankle and joints of left foot: Secondary | ICD-10-CM | POA: Diagnosis not present

## 2020-11-23 NOTE — Patient Instructions (Signed)

## 2020-11-23 NOTE — Therapy (Signed)
[facility] [address] [place], [place], [address] Phone: [phone]   Fax:  [phone]  Physical Therapy Evaluation  Patient Details  Name: Patricia Levine MRN: [id number] Date of Birth: 07/14/1971 Referring Provider (PT): Reino Bellis, DO   Encounter Date: 11/23/2020   PT End of Session - 11/23/20 1050    Visit Number 1    Number of Visits 12    Date for PT Re-Evaluation 01/04/21    Authorization Type BCBS    PT Start Time 1050    PT Stop Time 1144    PT Time Calculation (min) 54 min    Activity Tolerance Patient tolerated treatment well    Behavior During Therapy [facility] for tasks assessed/performed           Past Medical History:  Diagnosis Date  . Anemia    history  . Bronchitis   . Cervical dysplasia   . Diabetes mellitus without complication (HCC)    diet controlled and exercise  . Headache(784.0)    migraines  . Leaky heart valve   . Pneumonia   . PONV (postoperative nausea and vomiting)     Past Surgical History:  Procedure Laterality Date  . CESAREAN SECTION    . COLONOSCOPY    . CRYOTHERAPY    . CYSTOSCOPY N/A 04/16/2016   Procedure: CYSTOSCOPY;  Surgeon: Zelphia Cairo, MD;  Location: [facility];  Service: Gynecology;  Laterality: N/A;  . DILATION AND CURETTAGE OF UTERUS    . DILITATION & CURRETTAGE/HYSTROSCOPY WITH NOVASURE ABLATION N/A 09/09/2014   Procedure: DILATATION & CURETTAGE/HYSTEROSCOPY WITH NOVASURE ABLATION;  Surgeon: Zelphia Cairo, MD;  Location: [facility];  Service: Gynecology;  Laterality: N/A;  . LAPAROSCOPIC TUBAL LIGATION Bilateral 09/09/2014   Procedure: LAPAROSCOPIC TUBAL LIGATION with Filshie Clips;  Surgeon: Zelphia Cairo, MD;  Location: [facility];  Service: Gynecology;  Laterality: Bilateral;  . LAPAROSCOPIC VAGINAL HYSTERECTOMY WITH SALPINGECTOMY Bilateral 04/16/2016   Procedure: LAPAROSCOPIC ASSISTED VAGINAL HYSTERECTOMY WITH BILATERAL SALPINGECTOMY;  Surgeon: Zelphia Cairo, MD;   Location: [facility];  Service: Gynecology;  Laterality: Bilateral;  . TONSILLECTOMY    . WISDOM TOOTH EXTRACTION      There were no vitals filed for this visit.    Subjective Assessment - 11/23/20 1053    Subjective Pt. is a 49 y/o female referred to PT for left heel pain associated with plantar fasciitis. She runs for exercises and reports onset of pain around mid-August of this year. Symptoms worsened and she has since had limited ability to run with performance of alternatives including elliptical use and brief walks which have been better tolerated but still unable to run. She has been performing HEP of stretches and has also been wearing Strassburgs at night and is also currently wearing orthotics. She has had dry needling in the past for right-sided calf pain and noted benefit at the time and is interested in trying this again with a goal of returning to running.    Pertinent History diabetic    Limitations Standing;Walking   unable to run   Patient Stated Goals Return to running    Currently in Pain? No/denies   no pain at rest at eval but pain up to 8/10 with attempted running             [facility] PT Assessment - 11/23/20 0001      Assessment   Medical Diagnosis Left heel pain    Referring Provider (PT) Reino Bellis, DO    Onset Date/Surgical Date  08/06/20   estimated per report of onset of symptoms mid-August   Prior Therapy past PT a few years ago for right calf pain      Precautions   Precautions None      Restrictions   Weight Bearing Restrictions No      Balance Screen   Has the patient fallen in the past 6 months Yes      Prior Function   Level of Independence Independent with basic ADLs;Independent with community mobility without device      Cognition   Overall Cognitive Status Within Functional Limits for tasks assessed      Observation/Other Assessments   Focus on Therapeutic Outcomes (FOTO)  33% limited      ROM / Strength   AROM / PROM / Strength  AROM;PROM;Strength      AROM   AROM Assessment Site Ankle    Right/Left Ankle Right;Left    Right Ankle Dorsiflexion 10    Right Ankle Plantar Flexion 65    Right Ankle Inversion 35    Right Ankle Eversion 18    Left Ankle Dorsiflexion 10    Left Ankle Plantar Flexion 60    Left Ankle Inversion 35    Left Ankle Eversion 14      Strength   Strength Assessment Site Hip;Knee;Ankle    Right/Left Hip Right;Left    Right Hip Flexion 5/5    Right Hip Extension 4+/5    Right Hip External Rotation  5/5    Right Hip Internal Rotation 5/5    Right Hip ABduction 5/5    Left Hip Flexion 5/5    Left Hip Extension 4+/5    Left Hip External Rotation 4+/5    Left Hip Internal Rotation 5/5    Left Hip ABduction 4+/5    Right/Left Knee Right;Left    Right Knee Flexion 5/5    Right Knee Extension 5/5    Left Knee Flexion 5/5    Left Knee Extension 5/5    Right/Left Ankle Right;Left    Right Ankle Dorsiflexion 5/5    Right Ankle Plantar Flexion 5/5    Right Ankle Inversion 5/5    Right Ankle Eversion 5/5    Left Ankle Dorsiflexion 5/5    Left Ankle Plantar Flexion 5/5    Left Ankle Inversion 5/5    Left Ankle Eversion 5/5      Flexibility   Soft Tissue Assessment /Muscle Length --   mild left gastroc tightness     Palpation   Palpation comment tender to palpation with trigger points left medial>lateral gastrocnemius, mild talocrural hypomobility on left side, tender to palpation at plantar aspect of medial tubercle of calcaneus      Special Tests   Other special tests Windlass test (-)      Ambulation/Gait   Gait Comments No signigicant gait deviations noted, pt. has good arch/no pes planus or significant tendency for overpronation noted                      Objective measurements completed on examination: See above findings.       [facility] Adult PT Treatment/Exercise - 11/23/20 0001      Exercises   Exercises --   HEP handout review     Manual Therapy   Manual  Therapy Joint mobilization    Manual therapy comments brief left talocrural AP mobilizations with passive dorsiflexion after assessment of talocrural passive accessories  Trigger Point Dry Needling - 11/23/20 0001    Consent Given? Yes    Education Handout Provided Yes    Muscles Treated Lower Quadrant Gastrocnemius    Dry Needling Comments needling to left side gastroc medial and lateral aspects with 32 gauge 50 mm needles    Electrical Stimulation Performed with Dry Needling Yes    E-stim with Dry Needling Details TENS 20 pps x 10 minutes                PT Education - 11/23/20 1316    Education Details HEP, POC, dry needling    Person(s) Educated Patient    Methods Explanation;Handout;Verbal cues    Comprehension Verbalized understanding               PT Long Term Goals - 11/23/20 1325      PT LONG TERM GOAL #1   Title Independent with HEP    Baseline updated at eval    Time 6    Period Weeks    Status New    Target Date 01/04/21      PT LONG TERM GOAL #2   Title Improve FOTO outcome measure score to 20% or less limitation    Baseline 33% limited    Time 6    Period Weeks    Status New    Target Date 01/04/21      PT LONG TERM GOAL #3   Title Pt. to be able to resume running exercise with graded progression of running volume    Baseline unable-uses elliptical or walks    Time 6    Period Weeks    Status New    Target Date 01/04/21      PT LONG TERM GOAL #4   Title Increase left hip strength for hip abduction, ER and extension at least 1/2 MMT grade to improve running mechanics to decrease heel pain    Baseline 4+/5    Time 6    Period Weeks    Status New    Target Date 01/04/21      PT LONG TERM GOAL #5   Title Decrease left heel pain with activity to 3/10 or less at worst for improved functional activity tolerance for IADLs and exercise    Baseline 8/10 at worst    Time 6    Period Weeks    Status New    Target Date 01/04/21                   Plan - 11/23/20 1317    Clinical Impression Statement Pt. presents with left plantar heel pain consistent with plantar fasciitis-negative Windlass today but pain location and report of pain with initial first steps upon waking consistent with this etiology. She presents with mild talocrural hypomobility and gastroc region soft tissue restriction. Proximally she also presents with left>right hip weakness which suspect could be contributing to pain with running mechanics as well. Pt. would benefit from PT to help relieve pain and improve functional activity tolerance to assist return to running exercise.    Personal Factors and Comorbidities Time since onset of injury/illness/exacerbation;Comorbidity 1    Comorbidities diabetic    Examination-Activity Limitations Locomotion Level;Stand    Examination-Participation Restrictions --   running exercise   Stability/Clinical Decision Making Stable/Uncomplicated    Clinical Decision Making Low    Rehab Potential Good    PT Frequency --   1-2x/week   PT Duration 6 weeks    PT Treatment/Interventions Taping;ADLs/Self  Care Home Management;Gait training;Therapeutic exercise;Patient/family education;Manual techniques;Dry needling;Iontophoresis 4mg /ml Dexamethasone;Electrical Stimulation;Cryotherapy;Moist Heat;Therapeutic activities;Functional mobility training;Neuromuscular re-education    PT Next Visit Plan Talocrural AP mobs, STM/IASTM gastroc and plantar fascia, dry needling initial gastroc and pending response possible consideration of foot intrinsics, quadratus plantaris, gastroc and plantar fascia stretches, possible ionto, progress foot intrinsic strengthening and left hip closed chain strengthening    PT Home Exercise Plan Access code:    Consulted and Agree with Plan of Care Patient           Patient will benefit from skilled therapeutic intervention in order to improve the following deficits and impairments:   Decreased activity tolerance, Decreased strength, Pain, Impaired flexibility, Difficulty walking, Hypomobility  Visit Diagnosis: Pain in left ankle and joints of left foot     Problem List Patient Active Problem List   Diagnosis Date Noted  . Menorrhagia 04/16/2016  . Strain of right tibialis anterior muscle 09/19/2015  . Metatarsalgia of right foot 04/15/2012    04/17/2012, PT, DPT 11/23/20 1:31 PM  [facility] [address] [place], [place], [place] Phone: [phone]   Fax:  [phone]  Name: CATIA TODOROV MRN: Doristine Devoid Date of Birth: [birth year]

## 2020-12-04 ENCOUNTER — Ambulatory Visit: Payer: BC Managed Care – PPO | Admitting: Physical Therapy

## 2020-12-04 ENCOUNTER — Encounter: Payer: Self-pay | Admitting: Physical Therapy

## 2020-12-04 ENCOUNTER — Other Ambulatory Visit: Payer: Self-pay

## 2020-12-04 DIAGNOSIS — M25572 Pain in left ankle and joints of left foot: Secondary | ICD-10-CM

## 2020-12-04 NOTE — Therapy (Signed)
[facility] [address] [place], [place], [address] Phone: [phone]   Fax:  [phone]  Physical Therapy Treatment  Patient Details  Name: Patricia Levine MRN: [id number] Date of Birth: 16-Sep-1971 Referring Provider (PT): Reino Bellis, DO   Encounter Date: 12/04/2020   PT End of Session - 12/04/20 1102    Visit Number 2    Number of Visits 12    Date for PT Re-Evaluation 01/04/21    Authorization Type BCBS    PT Start Time 1016    PT Stop Time 1100    PT Time Calculation (min) 44 min    Activity Tolerance Patient tolerated treatment well    Behavior During Therapy [facility] for tasks assessed/performed           Past Medical History:  Diagnosis Date  . Anemia    history  . Bronchitis   . Cervical dysplasia   . Diabetes mellitus without complication (HCC)    diet controlled and exercise  . Headache(784.0)    migraines  . Leaky heart valve   . Pneumonia   . PONV (postoperative nausea and vomiting)     Past Surgical History:  Procedure Laterality Date  . CESAREAN SECTION    . COLONOSCOPY    . CRYOTHERAPY    . CYSTOSCOPY N/A 04/16/2016   Procedure: CYSTOSCOPY;  Surgeon: Zelphia Cairo, MD;  Location: [facility];  Service: Gynecology;  Laterality: N/A;  . DILATION AND CURETTAGE OF UTERUS    . DILITATION & CURRETTAGE/HYSTROSCOPY WITH NOVASURE ABLATION N/A 09/09/2014   Procedure: DILATATION & CURETTAGE/HYSTEROSCOPY WITH NOVASURE ABLATION;  Surgeon: Zelphia Cairo, MD;  Location: [facility];  Service: Gynecology;  Laterality: N/A;  . LAPAROSCOPIC TUBAL LIGATION Bilateral 09/09/2014   Procedure: LAPAROSCOPIC TUBAL LIGATION with Filshie Clips;  Surgeon: Zelphia Cairo, MD;  Location: [facility];  Service: Gynecology;  Laterality: Bilateral;  . LAPAROSCOPIC VAGINAL HYSTERECTOMY WITH SALPINGECTOMY Bilateral 04/16/2016   Procedure: LAPAROSCOPIC ASSISTED VAGINAL HYSTERECTOMY WITH BILATERAL SALPINGECTOMY;  Surgeon: Zelphia Cairo, MD;   Location: [facility];  Service: Gynecology;  Laterality: Bilateral;  . TONSILLECTOMY    . WISDOM TOOTH EXTRACTION      There were no vitals filed for this visit.   Subjective Assessment - 12/04/20 1017    Subjective " I have been still be careful with my running, I have done a hike for about 4 miles and it was fine, but I drove for about 6 hours and it made me sore this morning.    Patient Stated Goals Return to running    Currently in Pain? Yes    Pain Score 0-No pain   this moring 3/10   Pain Location Ankle    Pain Orientation Lower    Pain Onset More than a month ago    Pain Frequency Intermittent    Aggravating Factors  prolonged sitting    Pain Relieving Factors DN, exercise.              [facility] PT Assessment - 12/04/20 0001      Assessment   Medical Diagnosis Left heel pain    Referring Provider (PT) Reino Bellis, DO                         [facility] Adult PT Treatment/Exercise - 12/04/20 0001      Knee/Hip Exercises: Stretches   Active Hamstring Stretch 2 reps;30 seconds      Knee/Hip Exercises: Sidelying   Other Sidelying Knee/Hip  Exercises L hip ER in L sidelying 2 x 12      Manual Therapy   Manual Therapy Soft tissue mobilization    Manual therapy comments Grade V Talocrural manip    Soft tissue mobilization IASTM along the gastroc/ soleus, and over the medical calcaneal tubercle      Ankle Exercises: Stretches   Other Stretch PNF contract/ relax manual gastroc stretch 3 x 30 sec with 10 sec contraction      Ankle Exercises: Standing   Other Standing Ankle Exercises heel raise with ball between the ankles 2 x 15            Trigger Point Dry Needling - 12/04/20 0001    Consent Given? Yes    Education Handout Provided Previously provided    Muscles Treated Lower Quadrant Gastrocnemius    E-stim with Dry Needling Details TENS 20 pps x 8 minutes increasing frequency PRN    Gastrocnemius Response Twitch response elicited;Palpable increased  muscle length                PT Education - 12/04/20 1102    Education Details reviewed HEP and updated today    Person(s) Educated Patient    Methods Explanation;Verbal cues;Handout    Comprehension Verbalized understanding;Verbal cues required               PT Long Term Goals - 11/23/20 1325      PT LONG TERM GOAL #1   Title Independent with HEP    Baseline updated at eval    Time 6    Period Weeks    Status New    Target Date 01/04/21      PT LONG TERM GOAL #2   Title Improve FOTO outcome measure score to 20% or less limitation    Baseline 33% limited    Time 6    Period Weeks    Status New    Target Date 01/04/21      PT LONG TERM GOAL #3   Title Pt. to be able to resume running exercise with graded progression of running volume    Baseline unable-uses elliptical or walks    Time 6    Period Weeks    Status New    Target Date 01/04/21      PT LONG TERM GOAL #4   Title Increase left hip strength for hip abduction, ER and extension at least 1/2 MMT grade to improve running mechanics to decrease heel pain    Baseline 4+/5    Time 6    Period Weeks    Status New    Target Date 01/04/21      PT LONG TERM GOAL #5   Title Decrease left heel pain with activity to 3/10 or less at worst for improved functional activity tolerance for IADLs and exercise    Baseline 8/10 at worst    Time 6    Period Weeks    Status New    Target Date 01/04/21                 Plan - 12/04/20 1103    Clinical Impression Statement pt reports consistency with her HEP and notes decreaesd pain with activity such as hiking but does report increased pain with prolonged sitting/ driving. continued TPDN for L gastroc combined with E-stim followed with IASTM techniques and talocrural distraction manipilation. she responded well to posterior tib strengthening as well as working up the chain to the piriformis to help  promote distal control. No report of pain noted at end of  session.    PT Treatment/Interventions Taping;ADLs/Self Care Home Management;Gait training;Therapeutic exercise;Patient/family education;Manual techniques;Dry needling;Iontophoresis 4mg /ml Dexamethasone;Electrical Stimulation;Cryotherapy;Moist Heat;Therapeutic activities;Functional mobility training;Neuromuscular re-education    PT Next Visit Plan Talocrural AP mobs, STM/IASTM gastroc and plantar fascia, dry needling initial gastroc and pending response possible consideration of foot intrinsics, quadratus plantaris, gastroc and plantar fascia stretches, possible ionto, progress foot intrinsic strengthening and left hip closed chain strengthening    PT Home Exercise Plan Access code:    Consulted and Agree with Plan of Care Patient           Patient will benefit from skilled therapeutic intervention in order to improve the following deficits and impairments:  Decreased activity tolerance,Decreased strength,Pain,Impaired flexibility,Difficulty walking,Hypomobility  Visit Diagnosis: Pain in left ankle and joints of left foot     Problem List Patient Active Problem List   Diagnosis Date Noted  . Menorrhagia 04/16/2016  . Strain of right tibialis anterior muscle 09/19/2015  . Metatarsalgia of right foot 04/15/2012    04/17/2012 PT, DPT, LAT, ATC  12/04/20  11:07 AM      [facility] [address] [place], [place], [place] Phone: [phone]   Fax:  [phone]  Name: CHERONDA ERCK MRN: Doristine Devoid Date of Birth: 06/23/1971

## 2020-12-12 ENCOUNTER — Ambulatory Visit: Payer: BC Managed Care – PPO | Admitting: Sports Medicine

## 2020-12-13 ENCOUNTER — Encounter: Payer: Self-pay | Admitting: Physical Therapy

## 2020-12-13 ENCOUNTER — Other Ambulatory Visit: Payer: Self-pay

## 2020-12-13 ENCOUNTER — Ambulatory Visit: Payer: BC Managed Care – PPO | Admitting: Physical Therapy

## 2020-12-13 DIAGNOSIS — M25572 Pain in left ankle and joints of left foot: Secondary | ICD-10-CM

## 2020-12-13 NOTE — Therapy (Signed)
[facility] [address] [place], [place], [address] Phone: [phone]   Fax:  [phone]  Physical Therapy Treatment  Patient Details  Name: Patricia Levine MRN: [id number] Date of Birth: 1971/12/08 Referring Provider (PT): Reino Bellis, DO   Encounter Date: 12/13/2020   PT End of Session - 12/13/20 1152    Visit Number 3    Number of Visits 12    Date for PT Re-Evaluation 01/04/21    Authorization Type BCBS    PT Start Time 1146    PT Stop Time 1231    PT Time Calculation (min) 45 min    Activity Tolerance Patient tolerated treatment well    Behavior During Therapy [facility] for tasks assessed/performed           Past Medical History:  Diagnosis Date  . Anemia    history  . Bronchitis   . Cervical dysplasia   . Diabetes mellitus without complication (HCC)    diet controlled and exercise  . Headache(784.0)    migraines  . Leaky heart valve   . Pneumonia   . PONV (postoperative nausea and vomiting)     Past Surgical History:  Procedure Laterality Date  . CESAREAN SECTION    . COLONOSCOPY    . CRYOTHERAPY    . CYSTOSCOPY N/A 04/16/2016   Procedure: CYSTOSCOPY;  Surgeon: Zelphia Cairo, MD;  Location: [facility];  Service: Gynecology;  Laterality: N/A;  . DILATION AND CURETTAGE OF UTERUS    . DILITATION & CURRETTAGE/HYSTROSCOPY WITH NOVASURE ABLATION N/A 09/09/2014   Procedure: DILATATION & CURETTAGE/HYSTEROSCOPY WITH NOVASURE ABLATION;  Surgeon: Zelphia Cairo, MD;  Location: [facility];  Service: Gynecology;  Laterality: N/A;  . LAPAROSCOPIC TUBAL LIGATION Bilateral 09/09/2014   Procedure: LAPAROSCOPIC TUBAL LIGATION with Filshie Clips;  Surgeon: Zelphia Cairo, MD;  Location: [facility];  Service: Gynecology;  Laterality: Bilateral;  . LAPAROSCOPIC VAGINAL HYSTERECTOMY WITH SALPINGECTOMY Bilateral 04/16/2016   Procedure: LAPAROSCOPIC ASSISTED VAGINAL HYSTERECTOMY WITH BILATERAL SALPINGECTOMY;  Surgeon: Zelphia Cairo, MD;   Location: [facility];  Service: Gynecology;  Laterality: Bilateral;  . TONSILLECTOMY    . WISDOM TOOTH EXTRACTION      There were no vitals filed for this visit.   Subjective Assessment - 12/13/20 1148    Subjective " I am cautiously optimistic, I did try some interimittent running / walking and had some soreness. I am alittle sore today after runnig today is ais a 1-2/10 but will likely go away tomorrow."    Patient Stated Goals Return to running    Currently in Pain? Yes    Pain Score 2     Pain Location Ankle    Pain Descriptors / Indicators Aching    Pain Onset More than a month ago    Pain Frequency Intermittent    Aggravating Factors  prlonged standing    Pain Relieving Factors DN, exericse              [facility] PT Assessment - 12/13/20 0001      Assessment   Medical Diagnosis Left heel pain    Referring Provider (PT) Reino Bellis, DO                         [facility] Adult PT Treatment/Exercise - 12/13/20 0001      Knee/Hip Exercises: Stretches   Gastroc Stretch 2 reps;30 seconds;Both   slant board     Knee/Hip Exercises: Standing   Other Standing Knee Exercises LLE  bulgarian split squat 2 x 12    Other Standing Knee Exercises lateral band walks 2 x 20 with green theraband around ankles      Knee/Hip Exercises: Seated   Other Seated Knee/Hip Exercises hip ER seated 2 x 20 with green theraband      Knee/Hip Exercises: Sidelying   Hip ABduction Strengthening;Both    Other Sidelying Knee/Hip Exercises L hip ER in L sidelying 2 x 12      Manual Therapy   Manual therapy comments --    Soft tissue mobilization IASTM along the gastroc/ soleus, and over the medical calcaneal tubercle      Ankle Exercises: Stretches   Other Stretch PNF contract/ relax manual gastroc stretch 3 x 30 sec with 10 sec contraction            Trigger Point Dry Needling - 12/13/20 0001    Consent Given? Yes    Education Handout Provided Previously provided    E-stim with Dry  Needling Details TENS 20 pps x 10 minutes increasing frequency PRN    Gastrocnemius Response Twitch response elicited;Palpable increased muscle length             Balance Exercises - 12/13/20 0001      Balance Exercises: Standing   SLS Foam/compliant surface;2 reps   20 reps, standing on LLE with RLE hip flexion mimicking running cadence            PT Education - 12/13/20 1236    Education Details reviewed HEP and updated today. Dicussed with pt gradual increase in running, try running a consistent mileage / cadence for a week or two before increasing mileage or speed. When incrasing mileage or distance doing so in a gradual way 1/2 a mile to 1 mile.    Person(s) Educated Patient    Methods Explanation;Verbal cues    Comprehension Verbalized understanding;Verbal cues required               PT Long Term Goals - 11/23/20 1325      PT LONG TERM GOAL #1   Title Independent with HEP    Baseline updated at eval    Time 6    Period Weeks    Status New    Target Date 01/04/21      PT LONG TERM GOAL #2   Title Improve FOTO outcome measure score to 20% or less limitation    Baseline 33% limited    Time 6    Period Weeks    Status New    Target Date 01/04/21      PT LONG TERM GOAL #3   Title Pt. to be able to resume running exercise with graded progression of running volume    Baseline unable-uses elliptical or walks    Time 6    Period Weeks    Status New    Target Date 01/04/21      PT LONG TERM GOAL #4   Title Increase left hip strength for hip abduction, ER and extension at least 1/2 MMT grade to improve running mechanics to decrease heel pain    Baseline 4+/5    Time 6    Period Weeks    Status New    Target Date 01/04/21      PT LONG TERM GOAL #5   Title Decrease left heel pain with activity to 3/10 or less at worst for improved functional activity tolerance for IADLs and exercise    Baseline 8/10 at worst  Time 6    Period Weeks    Status New     Target Date 01/04/21                 Plan - 12/13/20 1242    Clinical Impression Statement pt reports she has started interval running and noted some soreness following but that it didn't stay and calmed down the next day. discussed with pt to avoide overdoing it and try runing a constant distance/ speed for atleast a week before increasing to assess response. Contninued TPDN today for the gatroc combined with E-stim followed with IASTM Techniques and CKC hip strengthening and balance training in SLS mimicking running mechanics/ loading.    PT Treatment/Interventions Taping;ADLs/Self Care Home Management;Gait training;Therapeutic exercise;Patient/family education;Manual techniques;Dry needling;Iontophoresis 4mg /ml Dexamethasone;Electrical Stimulation;Cryotherapy;Moist Heat;Therapeutic activities;Functional mobility training;Neuromuscular re-education    PT Next Visit Plan talocural mobs PRN, response to TPDN and continue PRN , STM/IASTM gastroc and plantar fascia,  gastroc and plantar fascia stretches, possible ionto, progress foot intrinsic strengthening and left hip closed chain strengthening, running mechanics, sports cord step-ups.    PT Home Exercise Plan Access code:    Consulted and Agree with Plan of Care Patient           Patient will benefit from skilled therapeutic intervention in order to improve the following deficits and impairments:  Decreased activity tolerance,Decreased strength,Pain,Impaired flexibility,Difficulty walking,Hypomobility  Visit Diagnosis: Pain in left ankle and joints of left foot     Problem List Patient Active Problem List   Diagnosis Date Noted  . Menorrhagia 04/16/2016  . Strain of right tibialis anterior muscle 09/19/2015  . Metatarsalgia of right foot 04/15/2012    04/17/2012 PT, DPT, LAT, ATC  12/13/20  12:49 PM      [facility] [address] [place], [place],  [place] Phone: [phone]   Fax:  [phone]  Name: SERRENA LINDERMAN MRN: Doristine Devoid Date of Birth: [birth year]

## 2020-12-20 ENCOUNTER — Ambulatory Visit: Payer: BC Managed Care – PPO | Admitting: Physical Therapy

## 2020-12-20 DIAGNOSIS — Z20822 Contact with and (suspected) exposure to covid-19: Secondary | ICD-10-CM | POA: Diagnosis not present

## 2020-12-20 DIAGNOSIS — U071 COVID-19: Secondary | ICD-10-CM | POA: Diagnosis not present

## 2020-12-27 ENCOUNTER — Ambulatory Visit: Payer: BC Managed Care – PPO | Admitting: Physical Therapy

## 2021-01-03 ENCOUNTER — Ambulatory Visit: Payer: BC Managed Care – PPO | Admitting: Physical Therapy

## 2021-01-04 ENCOUNTER — Ambulatory Visit: Payer: BC Managed Care – PPO | Admitting: Sports Medicine

## 2021-01-13 ENCOUNTER — Ambulatory Visit (INDEPENDENT_AMBULATORY_CARE_PROVIDER_SITE_OTHER): Payer: BC Managed Care – PPO

## 2021-01-13 ENCOUNTER — Encounter (HOSPITAL_COMMUNITY): Payer: Self-pay

## 2021-01-13 ENCOUNTER — Other Ambulatory Visit: Payer: Self-pay

## 2021-01-13 ENCOUNTER — Ambulatory Visit (HOSPITAL_COMMUNITY)
Admission: EM | Admit: 2021-01-13 | Discharge: 2021-01-13 | Disposition: A | Discharge status: Home / Self Care | Payer: BC Managed Care – PPO | Attending: Internal Medicine | Admitting: Internal Medicine

## 2021-01-13 DIAGNOSIS — S92251A Displaced fracture of navicular [scaphoid] of right foot, initial encounter for closed fracture: Secondary | ICD-10-CM

## 2021-01-13 DIAGNOSIS — M79671 Pain in right foot: Secondary | ICD-10-CM | POA: Diagnosis not present

## 2021-01-13 DIAGNOSIS — S92034A Nondisplaced avulsion fracture of tuberosity of right calcaneus, initial encounter for closed fracture: Secondary | ICD-10-CM | POA: Diagnosis not present

## 2021-01-13 DIAGNOSIS — M25571 Pain in right ankle and joints of right foot: Secondary | ICD-10-CM

## 2021-01-13 DIAGNOSIS — M7989 Other specified soft tissue disorders: Secondary | ICD-10-CM | POA: Diagnosis not present

## 2021-01-13 DIAGNOSIS — X501XXA Overexertion from prolonged static or awkward postures, initial encounter: Secondary | ICD-10-CM | POA: Diagnosis not present

## 2021-01-13 DIAGNOSIS — W19XXXA Unspecified fall, initial encounter: Secondary | ICD-10-CM

## 2021-01-13 MED ORDER — HYDROCODONE-ACETAMINOPHEN 5-325 MG PO TABS: 2.0000 | ORAL_TABLET | ORAL | 0 refills | Status: AC | PRN

## 2021-01-13 NOTE — Discharge Instructions (Addendum)
Wear the splint on your right leg until evaluated by orthopedics on Monday.  You have an appointment with Dr. Margarita Rana at 830 on Monday morning.  Use over-the-counter Tylenol as needed for mild to moderate pain and take the Norco as needed for severe pain.  Do not exceed 4000 mg of Tylenol in total daily.  You are not to bear weight at all on your right foot.  Use the crutches to get around.  You need to keep your right leg elevated as much as possible to help decrease swelling and aid in healing.

## 2021-01-13 NOTE — ED Provider Notes (Signed)
[facility]    CSN: [id number] Arrival date & time: 01/13/21  1258      History   Chief Complaint Chief Complaint  Patient presents with  . Ankle/Foot Injury    HPI Patricia Levine is a 50 y.o. female.   HPI   50 year old female here for evaluation of pain and swelling to her right foot and ankle.  Patient reports that in the early hours of the morning she was walking downstairs and she missed the last step with her right foot causing her to roll her ankle inward.  Patient states that she was not able to bear weight at the time of injury and can still not bear weight.  Patient is complaining of numbness to her toes as well.  Patient states that the [place] happened in [place] and she has had her foot in a dependent position with ice on it for the last several hours.  Past Medical History:  Diagnosis Date  . Anemia    history  . Bronchitis   . Cervical dysplasia   . Diabetes mellitus without complication (HCC)    diet controlled and exercise  . Headache(784.0)    migraines  . Leaky heart valve   . Pneumonia   . PONV (postoperative nausea and vomiting)     Patient Active Problem List   Diagnosis Date Noted  . Menorrhagia 04/16/2016  . Strain of right tibialis anterior muscle 09/19/2015  . Metatarsalgia of right foot 04/15/2012    Past Surgical History:  Procedure Laterality Date  . CESAREAN SECTION    . COLONOSCOPY    . CRYOTHERAPY    . CYSTOSCOPY N/A 04/16/2016   Procedure: CYSTOSCOPY;  Surgeon: Zelphia Cairo, MD;  Location: [facility];  Service: Gynecology;  Laterality: N/A;  . DILATION AND CURETTAGE OF UTERUS    . DILITATION & CURRETTAGE/HYSTROSCOPY WITH NOVASURE ABLATION N/A 09/09/2014   Procedure: DILATATION & CURETTAGE/HYSTEROSCOPY WITH NOVASURE ABLATION;  Surgeon: Zelphia Cairo, MD;  Location: [facility];  Service: Gynecology;  Laterality: N/A;  . LAPAROSCOPIC TUBAL LIGATION Bilateral 09/09/2014   Procedure: LAPAROSCOPIC TUBAL LIGATION with  Filshie Clips;  Surgeon: Zelphia Cairo, MD;  Location: [facility];  Service: Gynecology;  Laterality: Bilateral;  . LAPAROSCOPIC VAGINAL HYSTERECTOMY WITH SALPINGECTOMY Bilateral 04/16/2016   Procedure: LAPAROSCOPIC ASSISTED VAGINAL HYSTERECTOMY WITH BILATERAL SALPINGECTOMY;  Surgeon: Zelphia Cairo, MD;  Location: [facility];  Service: Gynecology;  Laterality: Bilateral;  . TONSILLECTOMY    . WISDOM TOOTH EXTRACTION      OB History   No obstetric history on file.      Home Medications    Prior to Admission medications   Medication Sig Start Date End Date Taking? Authorizing Provider  HYDROcodone-acetaminophen (NORCO/VICODIN) 5-325 MG tablet Take 2 tablets by mouth every 4 (four) hours as needed. 01/13/21  Yes Becky Augusta, NP  fluticasone [facility]) 50 MCG/ACT nasal spray INSTILL 2 SPRAYS INTO EACH NOSTRIL DAILY 07/28/15   [provider]  Multiple Vitamin (MULTIVITAMIN) tablet Take 1 tablet by mouth daily.    [provider]    Family History History reviewed. No pertinent family history.  Social History Social History   Tobacco Use  . Smoking status: Never Smoker  . Smokeless tobacco: Never Used  Substance Use Topics  . Alcohol use: Yes    Alcohol/week: 1.0 standard drink    Types: 1 Glasses of wine per week    Comment: nightly  . Drug use: No     Allergies   Codeine  Review of Systems Review of Systems  Constitutional: Negative for activity change, appetite change and fever.  Musculoskeletal: Positive for arthralgias, gait problem, joint swelling and myalgias.  Skin: Positive for color change. Negative for wound.  Hematological: Negative.   Psychiatric/Behavioral: Negative.      Physical Exam Triage Vital Signs ED Triage Vitals [01/13/21 1359]  Enc Vitals Group     BP      Pulse      Resp      Temp      Temp src      SpO2      Weight      Height      Head Circumference      Peak Flow      Pain Score 9     Pain Loc      Pain Edu?       Excl. in GC?    No data found.  Updated Vital Signs BP 132/76 (BP Location: Left Arm)   Pulse 74   Temp 99.8 F (37.7 C) (Oral)   Resp 18   LMP 04/08/2016 (Exact Date)   SpO2 100%   Visual Acuity Right Eye Distance:   Left Eye Distance:   Bilateral Distance:    Right Eye Near:   Left Eye Near:    Bilateral Near:     Physical Exam Vitals and nursing note reviewed.  Constitutional:      General: She is not in acute distress.    Appearance: Normal appearance. She is normal weight. She is not toxic-appearing.  HENT:     Head: Normocephalic and atraumatic.  Cardiovascular:     Rate and Rhythm: Normal rate and regular rhythm.     Pulses: Normal pulses.     Heart sounds: Normal heart sounds. No murmur heard. No gallop.   Pulmonary:     Effort: Pulmonary effort is normal.     Breath sounds: Normal breath sounds. No wheezing, rhonchi or rales.  Musculoskeletal:        General: Swelling, tenderness, deformity and signs of injury present.  Skin:    General: Skin is warm and dry.     Capillary Refill: Capillary refill takes less than 2 seconds.     Findings: Bruising present.  Neurological:     General: No focal deficit present.     Mental Status: She is alert and oriented to person, place, and time.  Psychiatric:        Mood and Affect: Mood normal.        Behavior: Behavior normal.        Thought Content: Thought content normal.        Judgment: Judgment normal.      UC Treatments / Results  Labs (all labs ordered are listed, but only abnormal results are displayed) Labs Reviewed - No data to display  EKG   Radiology DG Ankle Complete Right  Result Date: 01/13/2021 CLINICAL DATA:  Twisting injury.  Pain. EXAM: RIGHT ANKLE - COMPLETE 3+ VIEW COMPARISON:  12/02/2016 tibia/fibula films. FINDINGS: Suspect lateral soft tissue swelling about the hindfoot. No acute fracture or dislocation. Base of fifth metatarsal and talar dome intact. Favor soft tissue calcification  ventral to the navicula on the lateral view. No donor site identified. IMPRESSION: Favor soft tissue calcification anterior to the navicula on the lateral view. Correlate with point tenderness. Otherwise, no acute osseous abnormality. Electronically Signed   By: Jeronimo Greaves M.D.   On: 01/13/2021 14:22   DG Foot  Complete Right  Result Date: 01/13/2021 CLINICAL DATA:  Right foot pain and swelling after fall EXAM: RIGHT FOOT COMPLETE - 3+ VIEW COMPARISON:  None. FINDINGS: 7 mm linear bone fragment dorsal to the navicular bone on lateral view is favored to reflect a acute avulsion fracture. There is also a faint area of mineralized density adjacent to the lateral cortex of the calcaneus at the level of the calcaneocuboid joint, also favored to reflect an acute avulsion fracture. Remaining osseous structures are within normal limits. No malalignment. No significant arthropathy. There is soft tissue swelling at the lateral hindfoot and dorsal midfoot. IMPRESSION: 1. Suspect acute avulsion fracture of the dorsal navicular. Correlate with point tenderness. 2. Suspect additional acute avulsion fracture of the anterolateral calcaneus at the level of the calcaneocuboid joint. 3. Soft tissue swelling of the lateral hindfoot and dorsal midfoot. Electronically Signed   By: Duanne GuessNicholas  Plundo D.O.   On: 01/13/2021 14:36    Procedures Procedures (including critical care time)  Medications Ordered in UC Medications - No data to display  Initial Impression / Assessment and Plan / UC Course  I have reviewed the triage vital signs and the nursing notes.  Pertinent labs & imaging results that were available during my care of the patient were reviewed by me and considered in my medical decision making (see chart for details).   Patient is here for evaluation of right ankle pain and swelling that started after she rolled her ankle this morning coming down some steps.  Patient states that she is unable to bear weight.   There is marked edema and ecchymosis to the lateral aspect of her right midfoot and ankle.  Patient has no tenderness to her medial malleolus or to her tibial shaft.  Patient does have marked tenderness to the lateral malleolus, the area beneath her her malleolus, and to her midfoot from the line of her second toe through her fifth toes.  Patient's cap refill is approximately 4 seconds.  Patient can wiggle her toes but states she cannot feel them.  Her entire foot is cold but she was icing it for several hours.  Patient has a 1+ DP and PT pulse on the right.  Will send patient for x-rays of her right foot and ankle.  Right ankle x-ray independently evaluated by me.  Interpretation: The mortise joint is intact.  There is no fracture or dislocation of the distal fibula.  There is soft tissue swelling at the inferior aspect of the fibula.  Awaiting radiology overread.  Right foot films independently evaluated by me.  Interpretation: There is a possible avulsion fracture from the navicular on the lateral view.  Awaiting radiology overread.  Radiology over read of the ankle films agrees with my interpretation.  There interpretation of the foot x-rays also agrees with the avulsion fracture of the navicular and avulsion fracture of the anterior lateral aspect of the calcaneus.    Final Clinical Impressions(s) / UC Diagnoses   Final diagnoses:  Closed avulsion fracture of navicular bone of right foot, initial encounter  Closed nondisplaced avulsion fracture of tuberosity of right calcaneus, initial encounter     Discharge Instructions     Wear the splint on your right leg until evaluated by orthopedics on Monday.  You have an appointment with Dr. Margarita Ranaimothy Murphy at 830 on Monday morning.  Use over-the-counter Tylenol as needed for mild to moderate pain and take the Norco as needed for severe pain.  Do not exceed 4000 mg of Tylenol  in total daily.  You are not to bear weight at all on your right  foot.  Use the crutches to get around.  You need to keep your right leg elevated as much as possible to help decrease swelling and aid in healing.    ED Prescriptions    Medication Sig Dispense Auth. Provider   HYDROcodone-acetaminophen (NORCO/VICODIN) 5-325 MG tablet Take 2 tablets by mouth every 4 (four) hours as needed. 10 tablet Becky Augusta, NP     I have reviewed the PDMP during this encounter.   Becky Augusta, NP 01/13/21 [phone]

## 2021-01-13 NOTE — Progress Notes (Signed)
Orthopedic Tech Progress Note Patient Details:  Patricia Levine 1971-01-08 [id number]  Ortho Devices Type of Ortho Device: Crutches,Short leg splint Ortho Device/Splint Location: RLE Ortho Device/Splint Interventions: Ordered,Application,Adjustment   Post Interventions Patient Tolerated: Well,Ambulated well Instructions Provided: Poper ambulation with device,Care of device   Donald Pore 01/13/2021, 3:52 PM

## 2021-01-13 NOTE — ED Notes (Signed)
Ortho has been notified

## 2021-01-13 NOTE — ED Triage Notes (Signed)
Pt presents with right foot/ankle injury after missing her bottom step this morning and falling.

## 2021-01-15 DIAGNOSIS — M25571 Pain in right ankle and joints of right foot: Secondary | ICD-10-CM | POA: Diagnosis not present

## 2021-01-17 NOTE — Therapy (Signed)
[place], [place], [address] Phone: [phone]   Fax:  [phone]  Physical Therapy Treatment/Discharge  Patient Details  Name: Patricia Levine MRN: [id number] Date of Birth: Apr 29, 1971 Referring Provider (PT): Lilia Argue, DO   Encounter Date: 12/13/2020    Past Medical History:  Diagnosis Date  . Anemia    history  . Bronchitis   . Cervical dysplasia   . Diabetes mellitus without complication (HCC)    diet controlled and exercise  . Headache(784.0)    migraines  . Leaky heart valve   . Pneumonia   . PONV (postoperative nausea and vomiting)     Past Surgical History:  Procedure Laterality Date  . CESAREAN SECTION    . COLONOSCOPY    . CRYOTHERAPY    . CYSTOSCOPY N/A 04/16/2016   Procedure: CYSTOSCOPY;  Surgeon: Marylynn Pearson, MD;  Location: [facility];  Service: Gynecology;  Laterality: N/A;  . DILATION AND CURETTAGE OF UTERUS    . DILITATION & CURRETTAGE/HYSTROSCOPY WITH NOVASURE ABLATION N/A 09/09/2014   Procedure: DILATATION & CURETTAGE/HYSTEROSCOPY WITH NOVASURE ABLATION;  Surgeon: Marylynn Pearson, MD;  Location: [facility];  Service: Gynecology;  Laterality: N/A;  . LAPAROSCOPIC TUBAL LIGATION Bilateral 09/09/2014   Procedure: LAPAROSCOPIC TUBAL LIGATION with Filshie Clips;  Surgeon: Marylynn Pearson, MD;  Location: [facility];  Service: Gynecology;  Laterality: Bilateral;  . LAPAROSCOPIC VAGINAL HYSTERECTOMY WITH SALPINGECTOMY Bilateral 04/16/2016   Procedure: LAPAROSCOPIC ASSISTED VAGINAL HYSTERECTOMY WITH BILATERAL SALPINGECTOMY;  Surgeon: Marylynn Pearson, MD;  Location: [facility];  Service: Gynecology;  Laterality: Bilateral;  . TONSILLECTOMY    . WISDOM TOOTH EXTRACTION      There were no vitals filed for this visit.                                   PT Long Term Goals - 11/23/20 1325      PT LONG TERM GOAL #1   Title Independent with HEP    Baseline updated  at eval    Time 6    Period Weeks    Status New    Target Date 01/04/21      PT LONG TERM GOAL #2   Title Improve FOTO outcome measure score to 20% or less limitation    Baseline 33% limited    Time 6    Period Weeks    Status New    Target Date 01/04/21      PT LONG TERM GOAL #3   Title Pt. to be able to resume running exercise with graded progression of running volume    Baseline unable-uses elliptical or walks    Time 6    Period Weeks    Status New    Target Date 01/04/21      PT LONG TERM GOAL #4   Title Increase left hip strength for hip abduction, ER and extension at least 1/2 MMT grade to improve running mechanics to decrease heel pain    Baseline 4+/5    Time 6    Period Weeks    Status New    Target Date 01/04/21      PT LONG TERM GOAL #5   Title Decrease left heel pain with activity to 3/10 or less at worst for improved functional activity tolerance for IADLs and exercise    Baseline 8/10 at worst    Time 6    Period Weeks  Status New    Target Date 01/04/21                  Patient will benefit from skilled therapeutic intervention in order to improve the following deficits and impairments:  Decreased activity tolerance,Decreased strength,Pain,Impaired flexibility,Difficulty walking,Hypomobility  Visit Diagnosis: Pain in left ankle and joints of left foot     Problem List Patient Active Problem List   Diagnosis Date Noted  . Menorrhagia 04/16/2016  . Strain of right tibialis anterior muscle 09/19/2015  . Metatarsalgia of right foot 04/15/2012       PHYSICAL THERAPY DISCHARGE SUMMARY  Visits from Start of Care: 3  Current functional level related to goals / functional outcomes: Patient did not return for further therapy after last session 12/13/20. No further visits scheduled at this time. Was noting improvement at the time per notes-recommend MD follow up if further therapy needed or any changes in status   Remaining  deficits: NA   Education / Equipment: HEP Plan: Patient agrees to discharge.  Patient goals were not met. Patient is being discharged due to meeting the stated rehab goals.  ?????          Beaulah Dinning, PT, DPT 01/17/21 9:20 AM     [facility] [address] [place], [place], [address] Phone: [phone]   Fax:  [phone]  Name: Patricia Levine MRN: [id number] Date of Birth: 21-Jun-1971

## 2021-01-31 DIAGNOSIS — S93491A Sprain of other ligament of right ankle, initial encounter: Secondary | ICD-10-CM | POA: Diagnosis not present

## 2021-03-05 DIAGNOSIS — L821 Other seborrheic keratosis: Secondary | ICD-10-CM | POA: Diagnosis not present

## 2021-03-05 DIAGNOSIS — D2262 Melanocytic nevi of left upper limb, including shoulder: Secondary | ICD-10-CM | POA: Diagnosis not present

## 2021-03-05 DIAGNOSIS — D225 Melanocytic nevi of trunk: Secondary | ICD-10-CM | POA: Diagnosis not present

## 2021-03-05 DIAGNOSIS — D2261 Melanocytic nevi of right upper limb, including shoulder: Secondary | ICD-10-CM | POA: Diagnosis not present

## 2021-08-19 IMAGING — DX DG ANKLE COMPLETE 3+V*R*
3 series · 3 of 3 positions shown · non-contrast
Comparison: 12/02/2016 tibia/fibula films.

CLINICAL DATA: Twisting injury.  Pain.

EXAM:
RIGHT ANKLE - COMPLETE 3+ VIEW

[ankle ap]
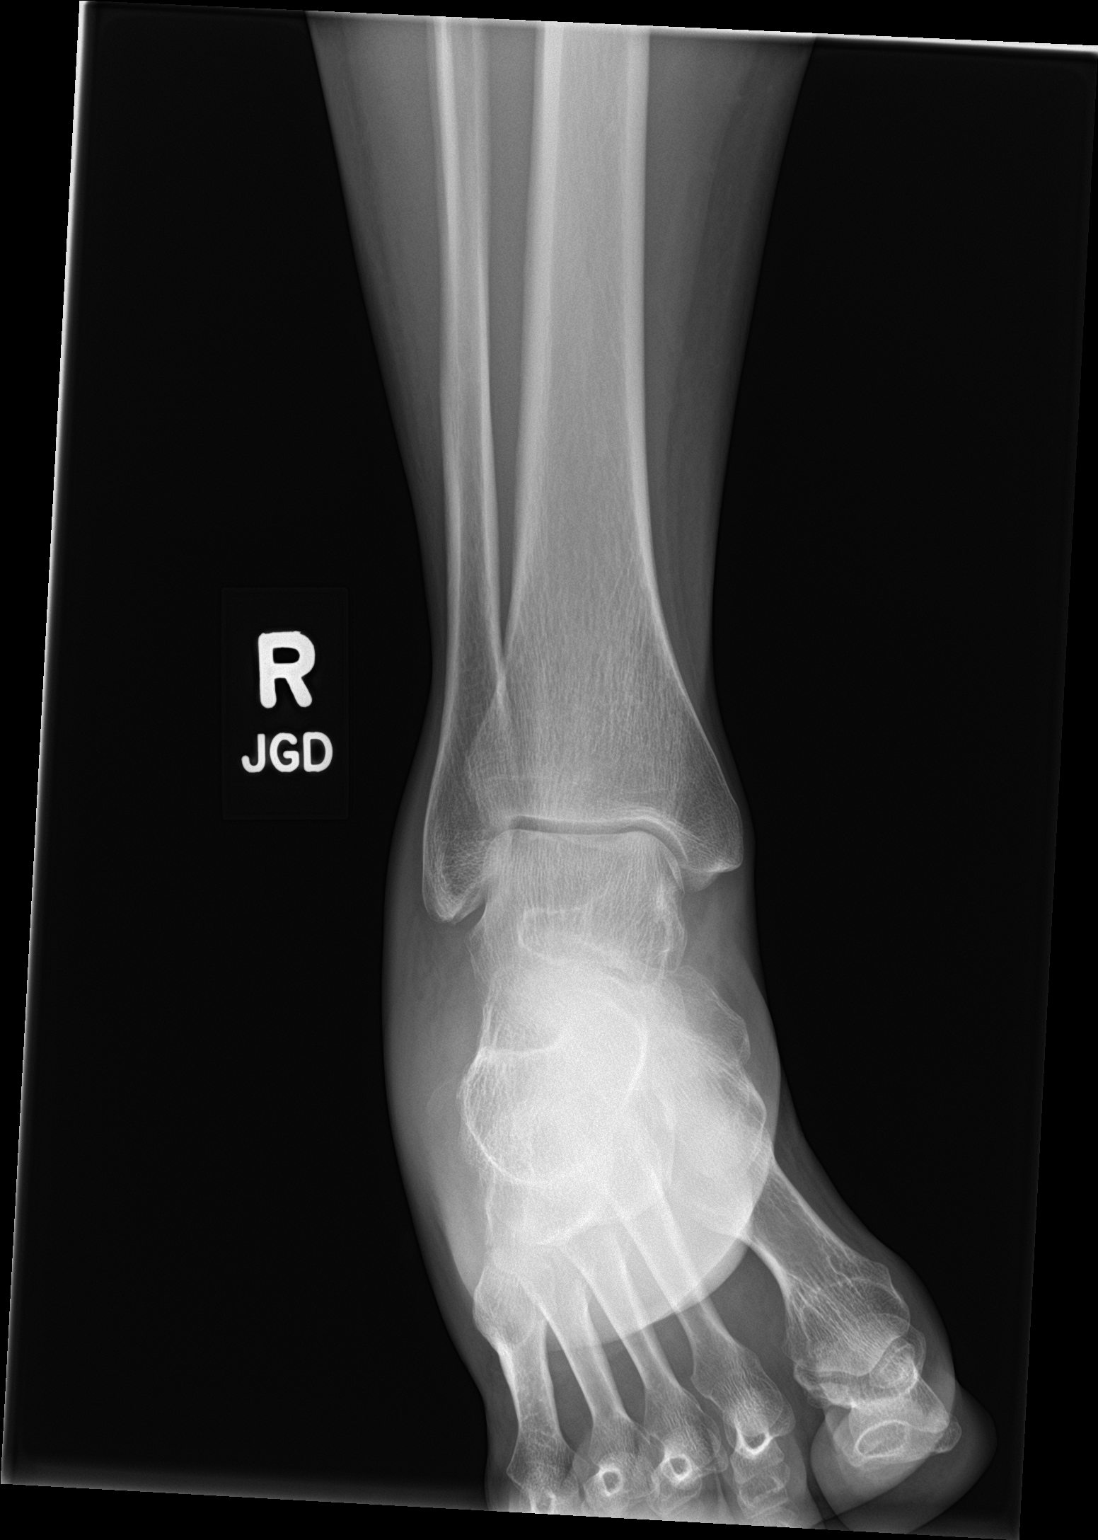

[ankle obl]
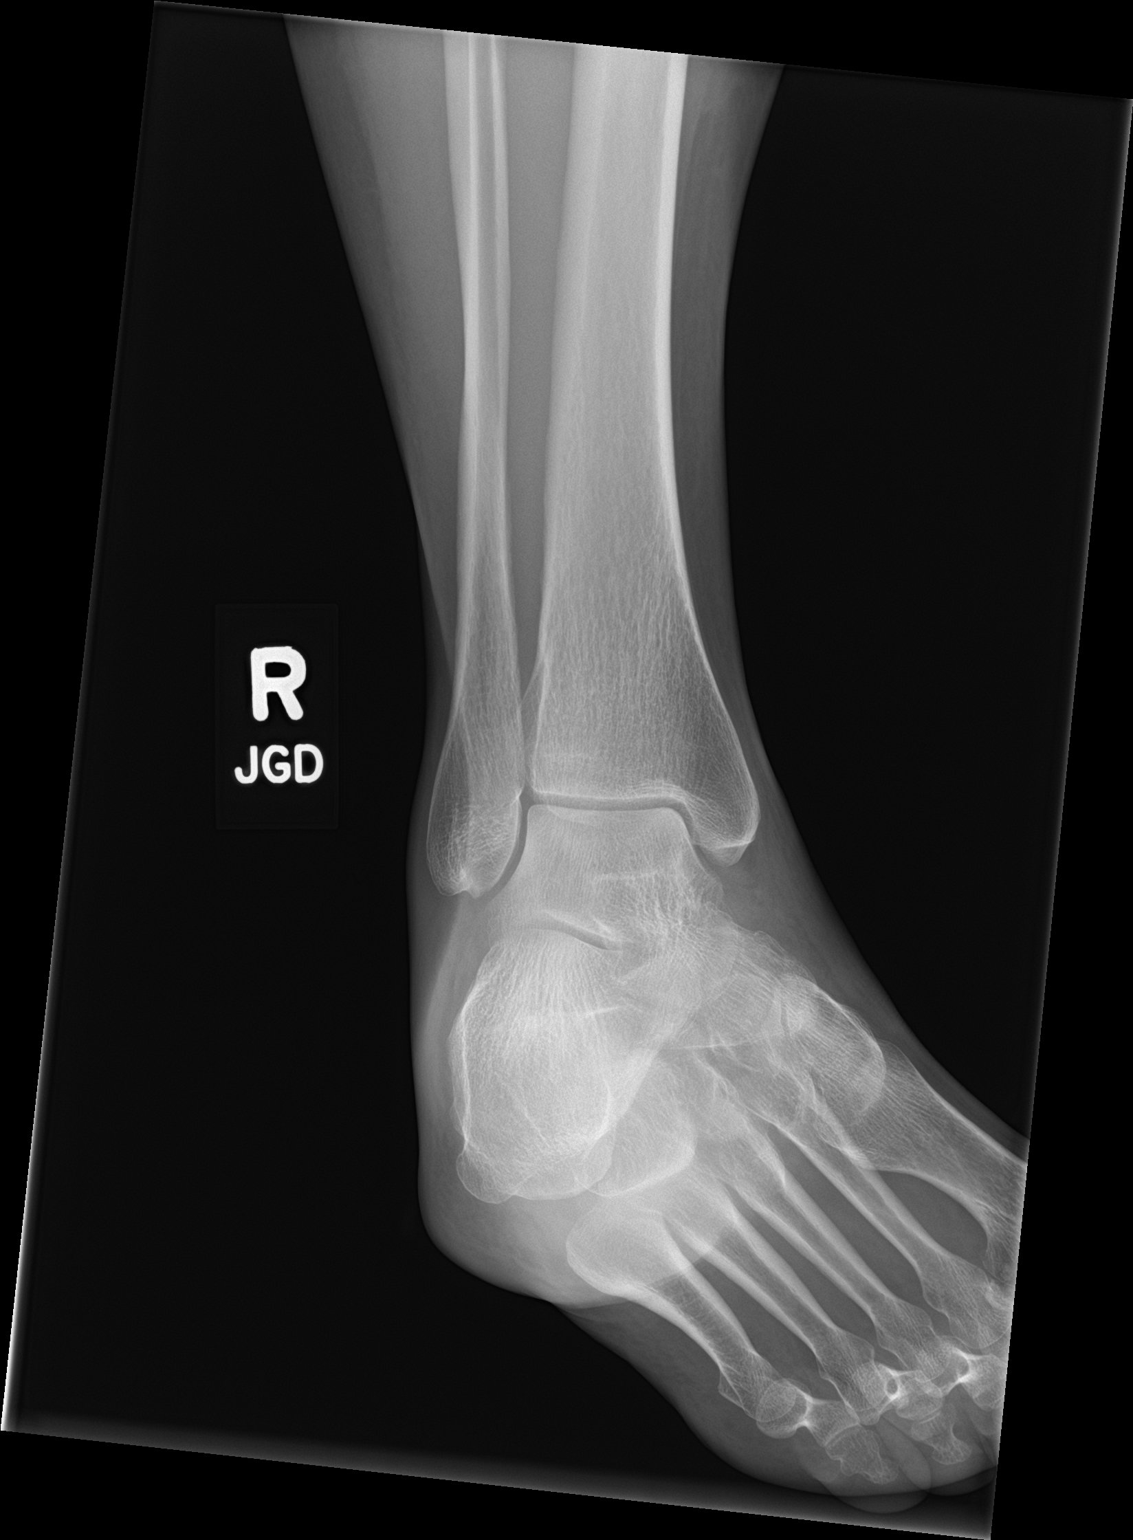

[ankle lat]
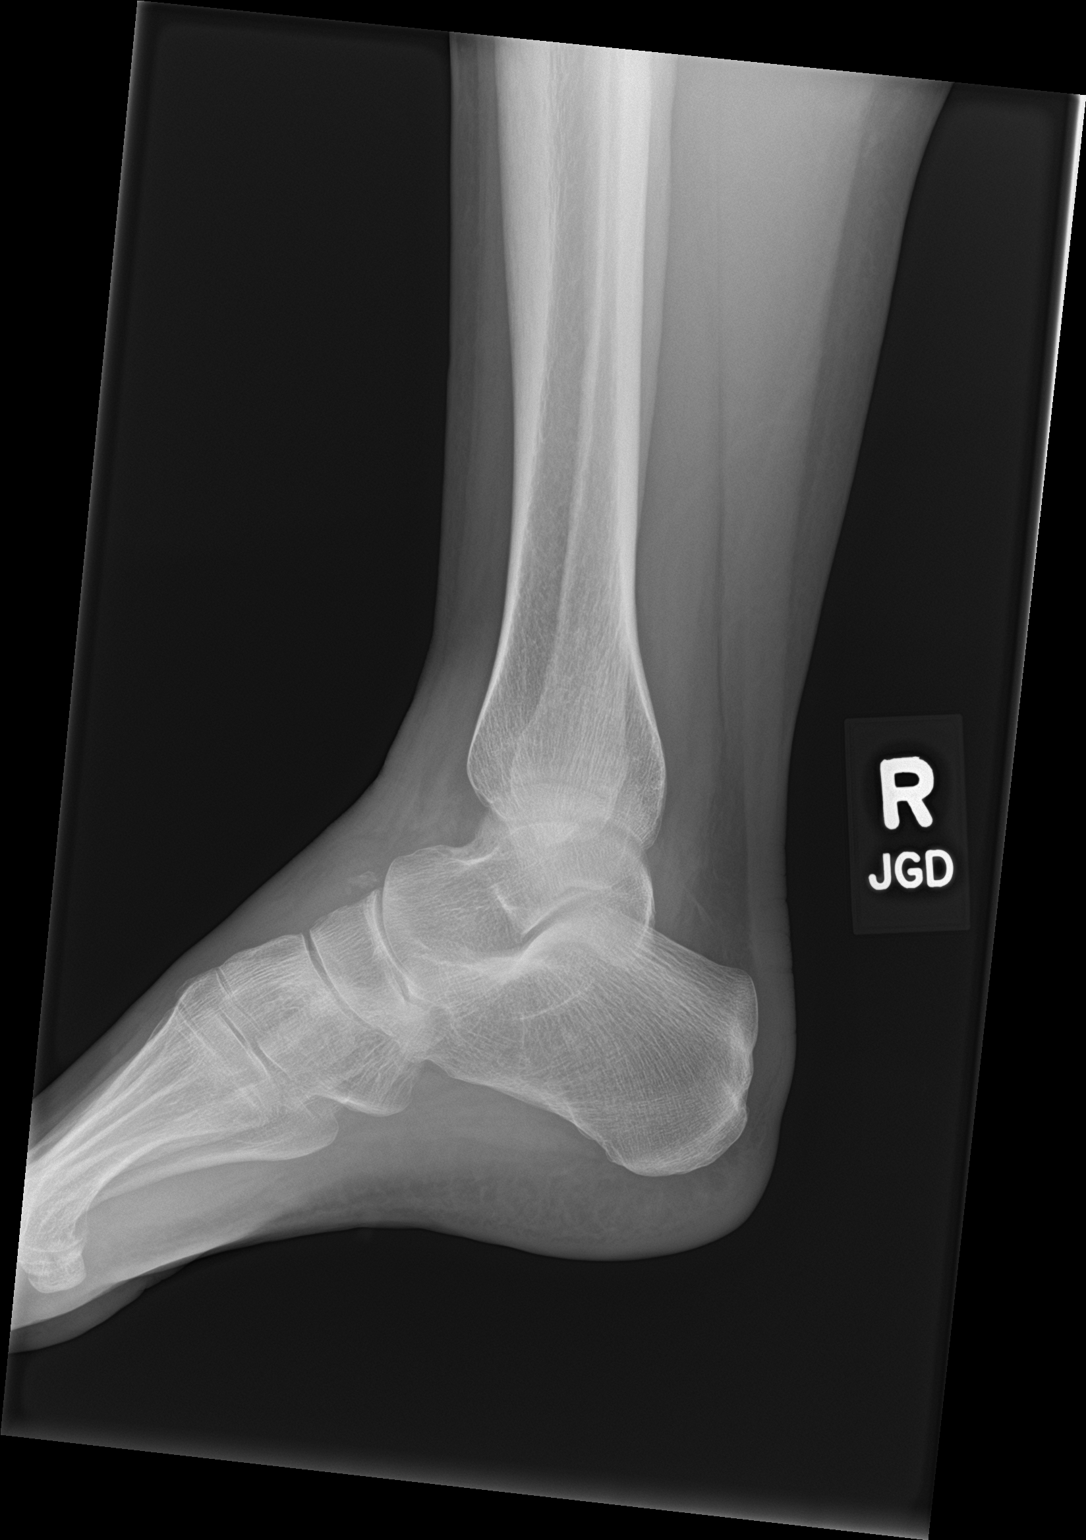

[3 of 3 positions shown; findings below may reference images not displayed]

FINDINGS: Suspect lateral soft tissue swelling about the hindfoot. No acute
fracture or dislocation. Base of fifth metatarsal and talar dome
intact. Favor soft tissue calcification ventral to the navicula on
the lateral view. No donor site identified.
IMPRESSION: Favor soft tissue calcification anterior to the navicula on the
lateral view. Correlate with point tenderness. Otherwise, no acute
osseous abnormality.

## 2021-11-01 DIAGNOSIS — K621 Rectal polyp: Secondary | ICD-10-CM | POA: Diagnosis not present

## 2021-11-01 DIAGNOSIS — D128 Benign neoplasm of rectum: Secondary | ICD-10-CM | POA: Diagnosis not present

## 2021-11-01 DIAGNOSIS — Z1211 Encounter for screening for malignant neoplasm of colon: Secondary | ICD-10-CM | POA: Diagnosis not present

## 2021-11-01 DIAGNOSIS — K635 Polyp of colon: Secondary | ICD-10-CM | POA: Diagnosis not present

## 2021-11-01 DIAGNOSIS — Z800 Family history of malignant neoplasm of digestive organs: Secondary | ICD-10-CM | POA: Diagnosis not present

## 2021-12-04 DIAGNOSIS — Z6823 Body mass index (BMI) 23.0-23.9, adult: Secondary | ICD-10-CM | POA: Diagnosis not present

## 2021-12-04 DIAGNOSIS — Z1231 Encounter for screening mammogram for malignant neoplasm of breast: Secondary | ICD-10-CM | POA: Diagnosis not present

## 2021-12-04 DIAGNOSIS — Z01419 Encounter for gynecological examination (general) (routine) without abnormal findings: Secondary | ICD-10-CM | POA: Diagnosis not present

## 2021-12-05 DIAGNOSIS — Z01419 Encounter for gynecological examination (general) (routine) without abnormal findings: Secondary | ICD-10-CM | POA: Diagnosis not present

## 2023-01-15 DIAGNOSIS — Z1231 Encounter for screening mammogram for malignant neoplasm of breast: Secondary | ICD-10-CM | POA: Diagnosis not present

## 2023-01-15 DIAGNOSIS — Z01419 Encounter for gynecological examination (general) (routine) without abnormal findings: Secondary | ICD-10-CM | POA: Diagnosis not present

## 2023-01-15 DIAGNOSIS — Z6822 Body mass index (BMI) 22.0-22.9, adult: Secondary | ICD-10-CM | POA: Diagnosis not present

## 2023-01-15 DIAGNOSIS — Z1272 Encounter for screening for malignant neoplasm of vagina: Secondary | ICD-10-CM | POA: Diagnosis not present

## 2023-01-15 DIAGNOSIS — Z1151 Encounter for screening for human papillomavirus (HPV): Secondary | ICD-10-CM | POA: Diagnosis not present

## 2023-03-10 DIAGNOSIS — D225 Melanocytic nevi of trunk: Secondary | ICD-10-CM | POA: Diagnosis not present

## 2023-03-10 DIAGNOSIS — D2272 Melanocytic nevi of left lower limb, including hip: Secondary | ICD-10-CM | POA: Diagnosis not present

## 2023-03-10 DIAGNOSIS — D485 Neoplasm of uncertain behavior of skin: Secondary | ICD-10-CM | POA: Diagnosis not present

## 2023-03-10 DIAGNOSIS — D2262 Melanocytic nevi of left upper limb, including shoulder: Secondary | ICD-10-CM | POA: Diagnosis not present

## 2023-03-10 DIAGNOSIS — D2261 Melanocytic nevi of right upper limb, including shoulder: Secondary | ICD-10-CM | POA: Diagnosis not present

## 2023-03-10 DIAGNOSIS — D492 Neoplasm of unspecified behavior of bone, soft tissue, and skin: Secondary | ICD-10-CM | POA: Diagnosis not present

## 2023-04-30 DIAGNOSIS — D485 Neoplasm of uncertain behavior of skin: Secondary | ICD-10-CM | POA: Diagnosis not present

## 2023-04-30 DIAGNOSIS — L988 Other specified disorders of the skin and subcutaneous tissue: Secondary | ICD-10-CM | POA: Diagnosis not present

## 2023-05-02 DIAGNOSIS — Z0000 Encounter for general adult medical examination without abnormal findings: Secondary | ICD-10-CM | POA: Diagnosis not present

## 2023-05-02 DIAGNOSIS — Z13220 Encounter for screening for lipoid disorders: Secondary | ICD-10-CM | POA: Diagnosis not present

## 2023-05-02 DIAGNOSIS — Z114 Encounter for screening for human immunodeficiency virus [HIV]: Secondary | ICD-10-CM | POA: Diagnosis not present

## 2023-05-12 DIAGNOSIS — H6590 Unspecified nonsuppurative otitis media, unspecified ear: Secondary | ICD-10-CM | POA: Diagnosis not present

## 2023-05-12 DIAGNOSIS — Z0000 Encounter for general adult medical examination without abnormal findings: Secondary | ICD-10-CM | POA: Diagnosis not present

## 2023-05-12 DIAGNOSIS — R42 Dizziness and giddiness: Secondary | ICD-10-CM | POA: Diagnosis not present

## 2023-07-30 DIAGNOSIS — E119 Type 2 diabetes mellitus without complications: Secondary | ICD-10-CM | POA: Diagnosis not present

## 2024-02-12 DIAGNOSIS — Z6825 Body mass index (BMI) 25.0-25.9, adult: Secondary | ICD-10-CM | POA: Diagnosis not present

## 2024-02-12 DIAGNOSIS — Z1231 Encounter for screening mammogram for malignant neoplasm of breast: Secondary | ICD-10-CM | POA: Diagnosis not present

## 2024-02-12 DIAGNOSIS — Z01419 Encounter for gynecological examination (general) (routine) without abnormal findings: Secondary | ICD-10-CM | POA: Diagnosis not present

## 2024-02-17 ENCOUNTER — Other Ambulatory Visit: Payer: Self-pay | Admitting: Obstetrics and Gynecology

## 2024-02-17 DIAGNOSIS — R928 Other abnormal and inconclusive findings on diagnostic imaging of breast: Secondary | ICD-10-CM

## 2024-02-21 ENCOUNTER — Ambulatory Visit
Admission: RE | Admit: 2024-02-21 | Discharge: 2024-02-21 | Disposition: A | Discharge status: Home / Self Care | Payer: BC Managed Care – PPO | Source: Ambulatory Visit | Attending: Obstetrics and Gynecology | Admitting: Obstetrics and Gynecology

## 2024-02-21 DIAGNOSIS — R928 Other abnormal and inconclusive findings on diagnostic imaging of breast: Secondary | ICD-10-CM | POA: Diagnosis not present

## 2024-03-11 DIAGNOSIS — D2272 Melanocytic nevi of left lower limb, including hip: Secondary | ICD-10-CM | POA: Diagnosis not present

## 2024-03-11 DIAGNOSIS — D2271 Melanocytic nevi of right lower limb, including hip: Secondary | ICD-10-CM | POA: Diagnosis not present

## 2024-03-11 DIAGNOSIS — D2261 Melanocytic nevi of right upper limb, including shoulder: Secondary | ICD-10-CM | POA: Diagnosis not present

## 2024-03-11 DIAGNOSIS — L905 Scar conditions and fibrosis of skin: Secondary | ICD-10-CM | POA: Diagnosis not present

## 2024-05-12 DIAGNOSIS — Z13220 Encounter for screening for lipoid disorders: Secondary | ICD-10-CM | POA: Diagnosis not present

## 2024-05-12 DIAGNOSIS — Z0000 Encounter for general adult medical examination without abnormal findings: Secondary | ICD-10-CM | POA: Diagnosis not present

## 2024-05-19 DIAGNOSIS — Z0000 Encounter for general adult medical examination without abnormal findings: Secondary | ICD-10-CM | POA: Diagnosis not present

## 2024-05-19 DIAGNOSIS — T753XXA Motion sickness, initial encounter: Secondary | ICD-10-CM | POA: Diagnosis not present

## 2024-05-19 DIAGNOSIS — F419 Anxiety disorder, unspecified: Secondary | ICD-10-CM | POA: Diagnosis not present

## 2024-05-19 DIAGNOSIS — H43391 Other vitreous opacities, right eye: Secondary | ICD-10-CM | POA: Diagnosis not present

## 2024-06-01 DIAGNOSIS — H31002 Unspecified chorioretinal scars, left eye: Secondary | ICD-10-CM | POA: Diagnosis not present

## 2024-06-01 DIAGNOSIS — H43811 Vitreous degeneration, right eye: Secondary | ICD-10-CM | POA: Diagnosis not present

## 2024-10-25 DIAGNOSIS — E119 Type 2 diabetes mellitus without complications: Secondary | ICD-10-CM | POA: Diagnosis not present
